# Patient Record
Sex: Female | Born: 1970 | Race: Black or African American | Hispanic: No | Marital: Single | Smoking: Former smoker
Health system: Southern US, Community
[De-identification: ages and names within clinical notes are randomized; demographics above are authoritative.]

## PROBLEM LIST (undated history)

## (undated) DIAGNOSIS — F32A Depression, unspecified: Secondary | ICD-10-CM

## (undated) DIAGNOSIS — F329 Major depressive disorder, single episode, unspecified: Secondary | ICD-10-CM

## (undated) DIAGNOSIS — D649 Anemia, unspecified: Secondary | ICD-10-CM

## (undated) DIAGNOSIS — J302 Other seasonal allergic rhinitis: Secondary | ICD-10-CM

## (undated) DIAGNOSIS — D279 Benign neoplasm of unspecified ovary: Secondary | ICD-10-CM

## (undated) DIAGNOSIS — Z973 Presence of spectacles and contact lenses: Secondary | ICD-10-CM

## (undated) DIAGNOSIS — F419 Anxiety disorder, unspecified: Secondary | ICD-10-CM

## (undated) DIAGNOSIS — Z5189 Encounter for other specified aftercare: Secondary | ICD-10-CM

## (undated) DIAGNOSIS — K219 Gastro-esophageal reflux disease without esophagitis: Secondary | ICD-10-CM

## (undated) DIAGNOSIS — N979 Female infertility, unspecified: Secondary | ICD-10-CM

## (undated) DIAGNOSIS — E282 Polycystic ovarian syndrome: Secondary | ICD-10-CM

## (undated) DIAGNOSIS — I10 Essential (primary) hypertension: Secondary | ICD-10-CM

## (undated) HISTORY — DX: Benign neoplasm of unspecified ovary: D27.9

## (undated) HISTORY — DX: Encounter for other specified aftercare: Z51.89

## (undated) HISTORY — PX: CYST EXCISION: SHX5701

## (undated) HISTORY — PX: KNEE SURGERY: SHX244

## (undated) HISTORY — DX: Gastro-esophageal reflux disease without esophagitis: K21.9

## (undated) HISTORY — DX: Female infertility, unspecified: N97.9

## (undated) HISTORY — DX: Presence of spectacles and contact lenses: Z97.3

## (undated) HISTORY — DX: Anemia, unspecified: D64.9

---

## 1999-08-26 ENCOUNTER — Other Ambulatory Visit: Admission: RE | Admit: 1999-08-26 | Discharge: 1999-08-26 | Payer: Self-pay | Admitting: Obstetrics and Gynecology

## 2001-08-23 ENCOUNTER — Other Ambulatory Visit: Admission: RE | Admit: 2001-08-23 | Discharge: 2001-08-23 | Payer: Self-pay | Admitting: Obstetrics and Gynecology

## 2006-02-16 ENCOUNTER — Ambulatory Visit (HOSPITAL_COMMUNITY): Admission: RE | Admit: 2006-02-16 | Discharge: 2006-02-16 | Payer: Self-pay | Admitting: Obstetrics and Gynecology

## 2009-10-19 ENCOUNTER — Ambulatory Visit (HOSPITAL_COMMUNITY): Admission: RE | Admit: 2009-10-19 | Discharge: 2009-10-19 | Payer: Self-pay | Admitting: Obstetrics and Gynecology

## 2010-11-08 LAB — CBC
HCT: 37.5 % (ref 36.0–46.0)
Hemoglobin: 12.5 g/dL (ref 12.0–15.0)
MCHC: 33.5 g/dL (ref 30.0–36.0)
RDW: 15.4 % (ref 11.5–15.5)

## 2010-11-08 LAB — HCG, SERUM, QUALITATIVE: Preg, Serum: NEGATIVE

## 2011-08-16 DIAGNOSIS — Z5189 Encounter for other specified aftercare: Secondary | ICD-10-CM

## 2011-08-16 HISTORY — DX: Encounter for other specified aftercare: Z51.89

## 2012-02-02 ENCOUNTER — Emergency Department (INDEPENDENT_AMBULATORY_CARE_PROVIDER_SITE_OTHER)
Admission: EM | Admit: 2012-02-02 | Discharge: 2012-02-02 | Disposition: A | Discharge status: Home / Self Care | Payer: Self-pay | Source: Home / Self Care | Attending: Emergency Medicine | Admitting: Emergency Medicine

## 2012-02-02 ENCOUNTER — Encounter (HOSPITAL_COMMUNITY): Payer: Self-pay | Admitting: *Deleted

## 2012-02-02 DIAGNOSIS — R05 Cough: Secondary | ICD-10-CM

## 2012-02-02 DIAGNOSIS — R059 Cough, unspecified: Secondary | ICD-10-CM

## 2012-02-02 DIAGNOSIS — Z760 Encounter for issue of repeat prescription: Secondary | ICD-10-CM

## 2012-02-02 HISTORY — DX: Essential (primary) hypertension: I10

## 2012-02-02 HISTORY — DX: Other seasonal allergic rhinitis: J30.2

## 2012-02-02 HISTORY — DX: Polycystic ovarian syndrome: E28.2

## 2012-02-02 MED ORDER — FEXOFENADINE HCL 180 MG PO TABS: 180.0000 mg | ORAL_TABLET | Freq: Every day | ORAL | Status: DC

## 2012-02-02 MED ORDER — METHYLDOPA 500 MG PO TABS: 500.0000 mg | ORAL_TABLET | Freq: Three times a day (TID) | ORAL | Status: DC

## 2012-02-02 MED ORDER — ALBUTEROL SULFATE HFA 108 (90 BASE) MCG/ACT IN AERS: 1.0000 | INHALATION_SPRAY | Freq: Four times a day (QID) | RESPIRATORY_TRACT | Status: DC | PRN

## 2012-02-02 MED ORDER — FLUTICASONE PROPIONATE 50 MCG/ACT NA SUSP: 2.0000 | Freq: Every day | NASAL | Status: DC

## 2012-02-02 MED ORDER — GUAIFENESIN-CODEINE 100-10 MG/5ML PO SYRP: 5.0000 mL | ORAL_SOLUTION | Freq: Four times a day (QID) | ORAL | Status: AC | PRN

## 2012-02-02 NOTE — Discharge Instructions (Signed)
Go to www.goodrx.com to look up your medications. This will give you a list of where you can find your prescriptions at the most affordable prices.   Call [facility]  [phone]  If you have no primary doctor, here are some resources that may be helpful:  Medicaid-accepting [facility] Providers:   - [facility]- [address]      [phone]      Mon-Fri 9am-7pm, Sat 9am-1pm   - [facility]- [address] [place], [place]      [phone]   - [facility]- [address] [place]      [phone]   [facility]- [address]      [phone]   - Renaye Rakers- [address] [place], [address]      [phone]      Only accepts [place] patients       after they have her name applied to their card   Self Pay (no insurance) in [place]:   - Sickle Cell Patients: Dr Willey Blade, [facility]      [address] [place]      [phone]   - [phone]   - Physician Referral Service- [phone]   - [facility]- [address] [place]      [phone]   [facility] [place]- [address]   - [facility]- see information above      (Speak to [employer] if you do not have insurance)   - [facility]- [address] [place]      [phone]   - [facility] [place]- [address]      [phone]   - [facility]- [address]      [phone]   - Dr Julio Sicks-  [address], [place]      [phone]   - [facility]- [address]      [phone]   - [facility]- [address]      [phone]      Also [address]      [phone]   - [facility]- [address]      [phone]      1st and 3rd Saturday every month, 10am-1pm    Other agencies that provide inexpensive medical care:     [facility]  [phone]    [facility]  [phone]    [facility]  [phone]    [facility]  [phone] [address] [place] [address]    [facility]  [phone]    [facility]  [phone] [facility] [phone]   [address] [place], [place] [address]  Chronic Pain Problems Contact [facility]  [phone] Patients need to be referred by their primary care doctor.  [facility]  [facility]     [employer]                          [facility] Dept. [address] [place]  [address]      [address] [place] [address]   [phone] (After Hours)  General Information: Finding a doctor when you do not have health insurance can be tricky. Although you are not limited by an insurance plan, you are of course limited by her finances and how much but he can pay out of pocket.  What are your options if you don't have health insurance?   1) Find a Librarian, academic and Pay Out of Pocket Although you won't have to find out who is covered by your insurance plan, it is a good idea to ask around and get recommendations. You will then need to call the office and see if the doctor you have chosen will accept you as a new patient and what types of options they offer for patients who are self-pay. Some doctors offer discounts or will set up payment plans for their patients who do not have insurance, but you will need to ask so you aren't surprised when you get to your appointment.  2) Contact Your Local Health Department Not all health departments have doctors that can see patients for sick visits, but many do, so it is worth a call to see if yours does. If you don't know where your local health department is, you can check in your phone book. The CDC also has a tool to help you locate your state's health department, and many state websites also have listings of  all of their local health departments.  3) Find a Walk-in Clinic If your illness is not likely to be very severe or complicated, you may want to try a walk in clinic. These are popping up all over the country in pharmacies, drugstores, and shopping centers. They're usually staffed by nurse practitioners or physician assistants that have been trained to treat common illnesses and complaints. They're usually fairly quick and inexpensive. However, if you have serious medical issues or chronic medical problems, these are probably not your best option

## 2012-02-02 NOTE — ED Provider Notes (Signed)
History     CSN: [id number]  Arrival date & time 02/02/12  1421   First MD Initiated Contact with Patient 02/02/12 1429      Chief Complaint  Patient presents with  . Cough    (Consider location/radiation/quality/duration/timing/severity/associated sxs/prior treatment) HPI Comments: Patient reports nasal congestion, itchy, watery eyes, sneezing, postnasal drip, nonproductive cough for 2 months. States her cough is worse at night, and she's unable asleep. Symptoms are worse around this time of year and after it rains. No nausea, vomiting, fevers, chest tightness, wheezing, shortness of breath.. No abdominal pain, waterbrash, reflux symptoms. She is also requesting refill of her Aldomet. States she ran out today. Is in the process of finding a new PMD.  ROS as noted in HPI. All other ROS negative.   Patient is a 41 y.o. female presenting with cough.  Cough This is a chronic problem. The current episode started more than 1 week ago. The problem occurs constantly. The problem has not changed since onset.The cough is non-productive. There has been no fever. Associated symptoms include rhinorrhea and sore throat. Pertinent negatives include no chest pain, no chills, no sweats, no ear congestion, no ear pain, no headaches, no myalgias, no shortness of breath and no eye redness. She has tried nothing for the symptoms. The treatment provided no relief. She is a smoker.    Past Medical History  Diagnosis Date  . Hypertension   . Polycystic ovary disease   . Seasonal allergies     Past Surgical History  Procedure Date  . Knee surgery     History reviewed. No pertinent family history.  History  Substance Use Topics  . Smoking status: Current Everyday Smoker  . Smokeless tobacco: Not on file  . Alcohol Use: Yes    OB History    Grav Para Term Preterm Abortions TAB SAB Ect Mult Living                  Review of Systems  Constitutional: Negative for chills.  HENT: Positive for  sore throat and rhinorrhea. Negative for ear pain.   Eyes: Negative for redness.  Respiratory: Positive for cough. Negative for shortness of breath.   Cardiovascular: Negative for chest pain.  Musculoskeletal: Negative for myalgias.  Neurological: Negative for headaches.    Allergies  Review of patient's allergies indicates no known allergies.  Home Medications   Current Outpatient Rx  Name Route Sig Dispense Refill  . METFORMIN HCL 1000 MG PO TABS Oral Take 1,000 mg by mouth 2 (two) times daily with a meal.    . ADULT MULTIVITAMIN W/MINERALS CH Oral Take 1 tablet by mouth daily.    . ALBUTEROL SULFATE HFA 108 (90 BASE) MCG/ACT IN AERS Inhalation Inhale 1-2 puffs into the lungs every 6 (six) hours as needed for wheezing. 1 Inhaler 0  . FEXOFENADINE HCL 180 MG PO TABS Oral Take 1 tablet (180 mg total) by mouth daily. 14 tablet 0  . FLUTICASONE PROPIONATE 50 MCG/ACT NA SUSP Nasal Place 2 sprays into the nose daily. 16 g 0  . GUAIFENESIN-CODEINE 100-10 MG/5ML PO SYRP Oral Take 5 mLs by mouth 4 (four) times daily as needed for cough. 120 mL 0  . METHYLDOPA 500 MG PO TABS Oral Take 1 tablet (500 mg total) by mouth 3 (three) times daily. 90 tablet 0    BP 139/88  Pulse 88  Temp 99.7 F (37.6 C) (Oral)  Resp 16  SpO2 100%  LMP 01/24/2012  Physical Exam  Nursing note and vitals reviewed. Constitutional: She is oriented to person, place, and time. She appears well-developed and well-nourished.  HENT:  Head: Normocephalic and atraumatic.  Right Ear: Tympanic membrane and ear canal normal.  Left Ear: Tympanic membrane and ear canal normal.  Nose: Mucosal edema and rhinorrhea present. No epistaxis.  Mouth/Throat: Uvula is midline and mucous membranes are normal. Posterior oropharyngeal erythema present. No oropharyngeal exudate.       Postnasal drainage, cobblestoned oropharynx (-) frontal, maxillary sinus tenderness  Eyes: Conjunctivae and EOM are normal.  Neck: Normal range of  motion. Neck supple.  Cardiovascular: Normal rate, regular rhythm and normal heart sounds.   Pulmonary/Chest: Effort normal and breath sounds normal. She exhibits no tenderness.  Abdominal: Bowel sounds are normal. She exhibits no distension.  Musculoskeletal: Normal range of motion.  Lymphadenopathy:    She has no cervical adenopathy.  Neurological: She is alert and oriented to person, place, and time.  Skin: Skin is warm and dry. No rash noted.  Psychiatric: She has a normal mood and affect. Her behavior is normal. Judgment and thought content normal.    ED Course  Procedures (including critical care time)  Labs Reviewed - No data to display No results found.   1. Cough   2. Medication refill      MDM  Cough most likely from postnasal drip. Patient has seasonal allergies, will start her Allegra, Flonase, saline nasal irrigation. Lungs are clear, but Also have her try some albuterol. Cough syrup as needed at night .Blood pressure is acceptable today. Took her last dose methyldopa this morning, will refill it until she can find primary care physician.  Luiz Blare, MD 02/02/12 [phone]

## 2012-02-02 NOTE — ED Notes (Signed)
Pt is also requesting refill of her Aldomet Rx.

## 2012-02-02 NOTE — ED Notes (Signed)
Pt is here with complaints of persistent cough x 2 months with seasonal allergies.  Reports cough is non-productive and denies fever.

## 2012-03-03 ENCOUNTER — Inpatient Hospital Stay (HOSPITAL_COMMUNITY): Payer: Self-pay

## 2012-03-03 ENCOUNTER — Emergency Department (HOSPITAL_COMMUNITY): Payer: Self-pay

## 2012-03-03 ENCOUNTER — Encounter (HOSPITAL_COMMUNITY): Payer: Self-pay | Admitting: Emergency Medicine

## 2012-03-03 ENCOUNTER — Inpatient Hospital Stay (HOSPITAL_COMMUNITY)
Admission: EM | Admit: 2012-03-03 | Discharge: 2012-03-04 | DRG: 392 | Disposition: A | Discharge status: Home / Self Care | Payer: Self-pay | Attending: Internal Medicine | Admitting: Internal Medicine

## 2012-03-03 DIAGNOSIS — R7309 Other abnormal glucose: Secondary | ICD-10-CM | POA: Diagnosis present

## 2012-03-03 DIAGNOSIS — D638 Anemia in other chronic diseases classified elsewhere: Secondary | ICD-10-CM | POA: Diagnosis present

## 2012-03-03 DIAGNOSIS — I10 Essential (primary) hypertension: Secondary | ICD-10-CM | POA: Diagnosis present

## 2012-03-03 DIAGNOSIS — D649 Anemia, unspecified: Secondary | ICD-10-CM

## 2012-03-03 DIAGNOSIS — D219 Benign neoplasm of connective and other soft tissue, unspecified: Secondary | ICD-10-CM | POA: Diagnosis present

## 2012-03-03 DIAGNOSIS — F17200 Nicotine dependence, unspecified, uncomplicated: Secondary | ICD-10-CM | POA: Diagnosis present

## 2012-03-03 DIAGNOSIS — R109 Unspecified abdominal pain: Principal | ICD-10-CM | POA: Diagnosis present

## 2012-03-03 DIAGNOSIS — D259 Leiomyoma of uterus, unspecified: Secondary | ICD-10-CM | POA: Diagnosis present

## 2012-03-03 DIAGNOSIS — Z79899 Other long term (current) drug therapy: Secondary | ICD-10-CM

## 2012-03-03 DIAGNOSIS — K625 Hemorrhage of anus and rectum: Secondary | ICD-10-CM | POA: Diagnosis present

## 2012-03-03 LAB — CBC WITH DIFFERENTIAL/PLATELET
Eosinophils Absolute: 0 10*3/uL (ref 0.0–0.7)
Lymphocytes Relative: 16 % (ref 12–46)
Lymphs Abs: 1.2 10*3/uL (ref 0.7–4.0)
MCHC: 28.8 g/dL — ABNORMAL LOW (ref 30.0–36.0)
Monocytes Absolute: 0.7 10*3/uL (ref 0.1–1.0)
Monocytes Relative: 9 % (ref 3–12)
Neutrophils Relative %: 75 % (ref 43–77)
Platelets: 519 10*3/uL — ABNORMAL HIGH (ref 150–400)
RBC: 3.74 MIL/uL — ABNORMAL LOW (ref 3.87–5.11)
WBC: 7.8 10*3/uL (ref 4.0–10.5)

## 2012-03-03 LAB — COMPREHENSIVE METABOLIC PANEL
AST: 21 U/L (ref 0–37)
Albumin: 4.1 g/dL (ref 3.5–5.2)
CO2: 21 mEq/L (ref 19–32)
Chloride: 99 mEq/L (ref 96–112)
GFR calc non Af Amer: >90 mL/min (ref >90)
Total Protein: 8 g/dL (ref 6.0–8.3)

## 2012-03-03 LAB — URINALYSIS, ROUTINE W REFLEX MICROSCOPIC
Bilirubin Urine: NEGATIVE
Glucose, UA: NEGATIVE mg/dL
Ketones, ur: NEGATIVE mg/dL
Nitrite: NEGATIVE
Specific Gravity, Urine: 1.017 (ref 1.005–1.030)
pH: 6 (ref 5.0–8.0)
pH: 6.5 (ref 5.0–8.0)

## 2012-03-03 LAB — OCCULT BLOOD, POC DEVICE: Fecal Occult Bld: POSITIVE

## 2012-03-03 LAB — PHOSPHORUS: Phosphorus: 3.2 mg/dL (ref 2.3–4.6)

## 2012-03-03 LAB — MAGNESIUM: Magnesium: 1.8 mg/dL (ref 1.5–2.5)

## 2012-03-03 LAB — URINE MICROSCOPIC-ADD ON

## 2012-03-03 LAB — RAPID URINE DRUG SCREEN, HOSP PERFORMED: Barbiturates: NOT DETECTED

## 2012-03-03 LAB — PREPARE RBC (CROSSMATCH)

## 2012-03-03 LAB — WET PREP, GENITAL: Trich, Wet Prep: NONE SEEN

## 2012-03-03 MED ORDER — ONDANSETRON HCL 4 MG/2ML IJ SOLN: 4.0000 mg | Freq: Four times a day (QID) | INTRAMUSCULAR | Status: DC | PRN

## 2012-03-03 MED ORDER — HYDROMORPHONE HCL PF 1 MG/ML IJ SOLN
1.0000 mg | Freq: Once | INTRAMUSCULAR | Status: AC
  Administered 2012-03-03: 1 mg via INTRAVENOUS
  Filled 2012-03-03: qty 1

## 2012-03-03 MED ORDER — CALCIUM GLUCONATE 500 MG PO TABS
500.0000 mg | ORAL_TABLET | Freq: Every day | ORAL | Status: DC
  Administered 2012-03-03 – 2012-03-04 (×2): 500 mg via ORAL
  Filled 2012-03-03 (×2): qty 1

## 2012-03-03 MED ORDER — MORPHINE SULFATE 2 MG/ML IJ SOLN
1.0000 mg | INTRAMUSCULAR | Status: DC | PRN
  Administered 2012-03-04 (×2): 1 mg via INTRAVENOUS
  Filled 2012-03-03 (×2): qty 1

## 2012-03-03 MED ORDER — SODIUM CHLORIDE 0.9 % IV SOLN
INTRAVENOUS | Status: DC
  Administered 2012-03-03: 23:00:00 via INTRAVENOUS

## 2012-03-03 MED ORDER — ONDANSETRON HCL 4 MG/2ML IJ SOLN
4.0000 mg | Freq: Once | INTRAMUSCULAR | Status: AC
  Administered 2012-03-03: 4 mg via INTRAVENOUS
  Filled 2012-03-03: qty 2

## 2012-03-03 MED ORDER — METHYLDOPA 500 MG PO TABS
500.0000 mg | ORAL_TABLET | Freq: Three times a day (TID) | ORAL | Status: DC
  Administered 2012-03-03 – 2012-03-04 (×2): 500 mg via ORAL
  Filled 2012-03-03 (×4): qty 1

## 2012-03-03 MED ORDER — METFORMIN HCL 500 MG PO TABS
1000.0000 mg | ORAL_TABLET | Freq: Two times a day (BID) | ORAL | Status: DC
  Filled 2012-03-03: qty 2

## 2012-03-03 MED ORDER — LORATADINE 10 MG PO TABS
10.0000 mg | ORAL_TABLET | Freq: Every day | ORAL | Status: DC
  Administered 2012-03-03 – 2012-03-04 (×2): 10 mg via ORAL
  Filled 2012-03-03 (×2): qty 1

## 2012-03-03 MED ORDER — AMLODIPINE BESYLATE 5 MG PO TABS
5.0000 mg | ORAL_TABLET | Freq: Every day | ORAL | Status: DC
  Administered 2012-03-04: 5 mg via ORAL
  Filled 2012-03-03: qty 1

## 2012-03-03 MED ORDER — IOHEXOL 300 MG/ML  SOLN
100.0000 mL | Freq: Once | INTRAMUSCULAR | Status: AC | PRN
  Administered 2012-03-03: 100 mL via INTRAVENOUS

## 2012-03-03 MED ORDER — HYDROCODONE-ACETAMINOPHEN 5-325 MG PO TABS
1.0000 | ORAL_TABLET | ORAL | Status: DC | PRN
  Administered 2012-03-04: 1 via ORAL
  Filled 2012-03-03: qty 1

## 2012-03-03 MED ORDER — FERROUS SULFATE 325 (65 FE) MG PO TABS
325.0000 mg | ORAL_TABLET | Freq: Every day | ORAL | Status: DC
  Administered 2012-03-04: 325 mg via ORAL
  Filled 2012-03-03 (×2): qty 1

## 2012-03-03 MED ORDER — ONDANSETRON HCL 4 MG PO TABS: 4.0000 mg | ORAL_TABLET | Freq: Four times a day (QID) | ORAL | Status: DC | PRN

## 2012-03-03 MED ORDER — FLUTICASONE PROPIONATE 50 MCG/ACT NA SUSP
2.0000 | Freq: Every day | NASAL | Status: DC
  Administered 2012-03-04: 2 via NASAL
  Filled 2012-03-03: qty 16

## 2012-03-03 NOTE — ED Notes (Signed)
Unable to obtain TSH lab, pt is receiving blood at this time.

## 2012-03-03 NOTE — ED Provider Notes (Signed)
History     CSN: [id number]  Arrival date & time 03/03/12  1259   First MD Initiated Contact with Patient 03/03/12 1753      Chief Complaint  Patient presents with  . Abdominal Pain    (Consider location/radiation/quality/duration/timing/severity/associated sxs/prior treatment) HPI Patient with right lower quadrant pain that began yesterday. She's had several episodes of vomiting today. She has not taken any medication for pain. She has not had any fever or chills. She has not noted any change in her menstrual cycle or her bowel habits. She denies any blood in the bowel. She is feeling generally weak and somewhat lightheaded. Past Medical History  Diagnosis Date  . Hypertension   . Polycystic ovary disease   . Seasonal allergies     Past Surgical History  Procedure Date  . Knee surgery     No family history on file.  History  Substance Use Topics  . Smoking status: Current Everyday Smoker -- 1.0 packs/day  . Smokeless tobacco: Never Used  . Alcohol Use: 4.8 oz/week    8 Cans of beer per week    OB History    Grav Para Term Preterm Abortions TAB SAB Ect Mult Living                  Review of Systems  All other systems reviewed and are negative.    Allergies  Review of patient's allergies indicates no known allergies.  Home Medications   Current Outpatient Rx  Name Route Sig Dispense Refill  . CALCIUM GLUCONATE 500 MG PO TABS Oral Take 500 mg by mouth daily.    Marland Kitchen CETIRIZINE HCL 10 MG PO TABS Oral Take 10 mg by mouth daily.    Marland Kitchen VITAMIN D 1000 UNITS PO TABS Oral Take 1,000 Units by mouth daily.    Marland Kitchen FERROUS SULFATE 325 (65 FE) MG PO TABS Oral Take 325 mg by mouth daily with breakfast.    . FLUTICASONE PROPIONATE 50 MCG/ACT NA SUSP Nasal Place 2 sprays into the nose daily. 16 g 0  . METFORMIN HCL 1000 MG PO TABS Oral Take 1,000 mg by mouth 2 (two) times daily with a meal.    . METHYLDOPA 500 MG PO TABS Oral Take 1 tablet (500 mg total) by mouth 3 (three)  times daily. 90 tablet 0  . ADULT MULTIVITAMIN W/MINERALS CH Oral Take 1 tablet by mouth daily.    Marland Kitchen VITAMIN C 500 MG PO TABS Oral Take 500 mg by mouth daily.      BP 189/95  Pulse 60  Temp 97.9 F (36.6 C) (Oral)  Resp 26  SpO2 100%  LMP 02/25/2012  Physical Exam  Nursing note and vitals reviewed. Constitutional: She is oriented to person, place, and time. She appears well-developed and well-nourished.  HENT:  Head: Normocephalic and atraumatic.  Right Ear: External ear normal.  Left Ear: External ear normal.  Nose: Nose normal.  Mouth/Throat: Oropharynx is clear and moist.  Eyes:       Conjunctiva pale  Cardiovascular: Normal rate and regular rhythm.   Pulmonary/Chest: Effort normal and breath sounds normal.  Abdominal: Soft. Bowel sounds are normal.       Tenderness rlq  Genitourinary: Vagina normal. Guaiac positive stool.       Scant blood in vaginal vault  Musculoskeletal: Normal range of motion.  Neurological: She is alert and oriented to person, place, and time. She has normal reflexes.  Skin: Skin is warm and dry.  Psychiatric: She  has a normal mood and affect.    ED Course  Procedures (including critical care time)  Labs Reviewed  URINALYSIS, ROUTINE W REFLEX MICROSCOPIC - Abnormal; Notable for the following:    Hgb urine dipstick LARGE (*)     All other components within normal limits  CBC WITH DIFFERENTIAL - Abnormal; Notable for the following:    RBC 3.74 (*)     Hemoglobin 7.7 (*)     HCT 26.7 (*)     MCV 71.4 (*)     MCH 20.6 (*)     MCHC 28.8 (*)     RDW 19.6 (*)     Platelets 519 (*)     All other components within normal limits  COMPREHENSIVE METABOLIC PANEL - Abnormal; Notable for the following:    Sodium 133 (*)     Glucose, Bld 129 (*)     BUN 4 (*)     Total Bilirubin 0.2 (*)     All other components within normal limits  POCT PREGNANCY, URINE  URINE MICROSCOPIC-ADD ON  OCCULT BLOOD X 1 CARD TO LAB, STOOL  PREPARE RBC (CROSSMATCH)    TYPE AND SCREEN   No results found.   No diagnosis found.    Results for orders placed during the hospital encounter of 03/03/12  URINALYSIS, ROUTINE W REFLEX MICROSCOPIC      Component Value Range   Color, Urine YELLOW  YELLOW   APPearance CLEAR  CLEAR   Specific Gravity, Urine 1.017  1.005 - 1.030   pH 6.0  5.0 - 8.0   Glucose, UA NEGATIVE  NEGATIVE mg/dL   Hgb urine dipstick LARGE (*) NEGATIVE   Bilirubin Urine NEGATIVE  NEGATIVE   Ketones, ur NEGATIVE  NEGATIVE mg/dL   Protein, ur NEGATIVE  NEGATIVE mg/dL   Urobilinogen, UA 0.2  0.0 - 1.0 mg/dL   Nitrite NEGATIVE  NEGATIVE   Leukocytes, UA NEGATIVE  NEGATIVE  CBC WITH DIFFERENTIAL      Component Value Range   WBC 7.8  4.0 - 10.5 K/uL   RBC 3.74 (*) 3.87 - 5.11 MIL/uL   Hemoglobin 7.7 (*) 12.0 - 15.0 g/dL   HCT 16.1 (*) 09.6 - 04.5 %   MCV 71.4 (*) 78.0 - 100.0 fL   MCH 20.6 (*) 26.0 - 34.0 pg   MCHC 28.8 (*) 30.0 - 36.0 g/dL   RDW 40.9 (*) 81.1 - 91.4 %   Platelets 519 (*) 150 - 400 K/uL   Neutrophils Relative 75  43 - 77 %   Lymphocytes Relative 16  12 - 46 %   Monocytes Relative 9  3 - 12 %   Eosinophils Relative 0  0 - 5 %   Basophils Relative 0  0 - 1 %   Neutro Abs 5.9  1.7 - 7.7 K/uL   Lymphs Abs 1.2  0.7 - 4.0 K/uL   Monocytes Absolute 0.7  0.1 - 1.0 K/uL   Eosinophils Absolute 0.0  0.0 - 0.7 K/uL   Basophils Absolute 0.0  0.0 - 0.1 K/uL   Smear Review MORPHOLOGY UNREMARKABLE    COMPREHENSIVE METABOLIC PANEL      Component Value Range   Sodium 133 (*) 135 - 145 mEq/L   Potassium 3.6  3.5 - 5.1 mEq/L   Chloride 99  96 - 112 mEq/L   CO2 21  19 - 32 mEq/L   Glucose, Bld 129 (*) 70 - 99 mg/dL   BUN 4 (*) 6 - 23 mg/dL  Creatinine, Ser 0.62  0.50 - 1.10 mg/dL   Calcium 9.5  8.4 - 16.1 mg/dL   Total Protein 8.0  6.0 - 8.3 g/dL   Albumin 4.1  3.5 - 5.2 g/dL   AST 21  0 - 37 U/L   ALT 9  0 - 35 U/L   Alkaline Phosphatase 52  39 - 117 U/L   Total Bilirubin 0.2 (*) 0.3 - 1.2 mg/dL   GFR calc non Af  Amer >90  >90 mL/min   GFR calc Af Amer >90  >90 mL/min  POCT PREGNANCY, URINE      Component Value Range   Preg Test, Ur NEGATIVE  NEGATIVE  URINE MICROSCOPIC-ADD ON      Component Value Range   Squamous Epithelial / LPF RARE  RARE   RBC / HPF TOO NUMEROUS TO COUNT  <3 RBC/hpf  PREPARE RBC (CROSSMATCH)      Component Value Range   Order Confirmation ORDER PROCESSED BY BLOOD BANK    TYPE AND SCREEN      Component Value Range   ABO/RH(D) O POS     Antibody Screen NEG     Sample Expiration 03/06/2012     Unit Number [id number]     Blood Component Type RED CELLS,LR     Unit division 00     Status of Unit ALLOCATED     Transfusion Status OK TO TRANSFUSE     Crossmatch Result Compatible    OCCULT BLOOD, POC DEVICE      Component Value Range   Fecal Occult Bld POSITIVE    URINALYSIS, ROUTINE W REFLEX MICROSCOPIC      Component Value Range   Color, Urine YELLOW  YELLOW   APPearance CLEAR  CLEAR   Specific Gravity, Urine 1.008  1.005 - 1.030   pH 6.5  5.0 - 8.0   Glucose, UA NEGATIVE  NEGATIVE mg/dL   Hgb urine dipstick NEGATIVE  NEGATIVE   Bilirubin Urine NEGATIVE  NEGATIVE   Ketones, ur NEGATIVE  NEGATIVE mg/dL   Protein, ur NEGATIVE  NEGATIVE mg/dL   Urobilinogen, UA 0.2  0.0 - 1.0 mg/dL   Nitrite NEGATIVE  NEGATIVE   Leukocytes, UA NEGATIVE  NEGATIVE  ABO/RH      Component Value Range   ABO/RH(D) O POS    WET PREP, GENITAL      Component Value Range   Yeast Wet Prep HPF POC NONE SEEN  NONE SEEN   Trich, Wet Prep NONE SEEN  NONE SEEN   Clue Cells Wet Prep HPF POC FEW (*) NONE SEEN   WBC, Wet Prep HPF POC FEW (*) NONE SEEN      Rectal bleeding patient has heme positive stool hemoglobin decreased to 7.8 from 12 in 2011.  Patient also has right lower quadrant pain. She has polycystic ovary disease and has somewhat irregular periods but has been on her menstrual cycle over the past week and bleeding has almost stopped. She does not describe her recent meds her menstrual  cycles is any heavier than previous. Having one unit of packed red blood cells transfused. CT scan is currently pending.   9:38 PM Discussed with Dr. Izola Price and she will place orders.  Patient remains hemodynamically stable transfusing one unit prbc.  No obvious source of pain on ct scan and pain is mild on palpation.  Urine clear on cath specimen.    CRITICAL CARE Performed by: Hilario Quarry   Total critical care time: 30  Critical care  time was exclusive of separately billable procedures and treating other patients.  Critical care was necessary to treat or prevent imminent or life-threatening deterioration.  Critical care was time spent personally by me on the following activities: development of treatment plan with patient and/or surrogate as well as nursing, discussions with consultants, evaluation of patient's response to treatment, examination of patient, obtaining history from patient or surrogate, ordering and performing treatments and interventions, ordering and review of laboratory studies, ordering and review of radiographic studies, pulse oximetry and re-evaluation of patient's condition.   Hilario Quarry, MD 03/03/12 2139

## 2012-03-03 NOTE — ED Notes (Signed)
Presents w/ RLQ pain onset yesterday morning, N/V twice while at work yesterday. Rx'ed w/ Miralax w/ BM but did not have any positive effect on pain. Menses currently. Denies [place] Sx.

## 2012-03-03 NOTE — H&P (Addendum)
[facility] History and Physical  SHANAIYA BENE ZOX:[id number] DOB: 1971/07/06 DOA: 03/03/2012  Referring physician: ED physician PCP: No primary provider on file.   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 Pain is rather chronic in nature on the left side of the abdomen and unchanged but right lower quadrant abdominal pain is new since yesterday and much worse over the past 24 hours. Pt denies similar episodes in the past, denies fevers, chills, shortness of breath, chest pain, also denies urinary concerns. Pt denies specific focal neurologic weakness. She get frequent [place] for follow up on tumor and the last one was 05/2011. She denies noticing any blood in stool and is currently towards the end of her menstrual cycle.   Assessment and Plan:  Principal Problem:  *Abdominal  pain, other specified site with nausea and vomiting - unclear etiology at this time but possibly related to fibroid as noted on CT scan - recommendation is to proceed with pelvic ultrasound for further evaluation and will therefore place the order - will also provide supportive care with analgesia as needed for adequate pain control, antiemetics as needed - may need GYN consult in AM  Active Problems:  Anemia due to chronic illness, microcytic  - unclear etiology and unclear if rectal bleeding only contributing etiology vs fibroid - pelvic exam done by Dr. Rosalia Hammers in ED and only scant blood noted in vaginal vault  - FOBT was positive - will go ahead and proceed with transfusion as was already started in ED - will  obtain follow up CBC and will decide if further transfusion indicated - also check CBC in AM   Rectal bleeding - FOBT positive - pt denies any visible blood in stool - CBC in AM   Fibroid - as noted per CT scan - this may likely be worked up in an outpt setting but will defer to primary team in AM to make the decision   Accelerated hypertension - will initiate blood pressure medication - monitor vitals per floor protocol and readjust the regimen as indicated   Hyperglycemia - will check A1C for now  Code Status: Full Family Communication: Pt at bedside Disposition Plan: PT evaluation    Review of Systems:  Constitutional: Negative for fever, chills, positive for malaise/fatigue. Negative for diaphoresis.  HENT: Negative for hearing loss, ear pain, nosebleeds, congestion, sore throat, neck pain, tinnitus and ear discharge.   Eyes: Negative for blurred vision, double vision, photophobia, pain, discharge and redness.  Respiratory: Negative for cough, hemoptysis, sputum production, shortness of breath, wheezing and stridor.   Cardiovascular: Negative for chest pain, palpitations, orthopnea, claudication and leg swelling.  Gastrointestinal: Positive for nausea, and abdominal pain. Negative for heartburn, constipation, negative for blood in stool. Genitourinary: Negative for dysuria, urgency, frequency, hematuria and flank pain.  Musculoskeletal: Negative for myalgias, back pain, joint pain and falls.  Skin: Negative for itching and rash.  Neurological: Negative for dizziness, positive for generalized weakness. Negative for tingling, tremors, sensory change, speech change, focal weakness, loss of consciousness and headaches.  Endo/Heme/Allergies: Negative for environmental allergies and polydipsia. Does not bruise/bleed easily.  Psychiatric/Behavioral: Negative  for suicidal ideas. The patient is not nervous/anxious.      Past Medical History  Diagnosis Date  . Hypertension   .  Polycystic ovary disease   . Seasonal allergies     Past Surgical History  Procedure Date  . Knee surgery     Social History:  reports that she has been smoking.  She has never used smokeless tobacco. She reports that she drinks about 4.8 ounces of alcohol per week. She reports that she does not use illicit drugs.  No Known Allergies  Family history, no cardiac disease, no strokes, no cancers.  Prior to Admission medications   Medication Sig Start Date End Date Taking? Authorizing Provider  calcium gluconate 500 MG tablet Take 500 mg by mouth daily.   Yes Historical Provider, MD  cetirizine (ZYRTEC) 10 MG tablet Take 10 mg by mouth daily.   Yes Historical Provider, MD  cholecalciferol (VITAMIN D) 1000 UNITS tablet Take 1,000 Units by mouth daily.   Yes Historical Provider, MD  ferrous sulfate 325 (65 FE) MG tablet Take 325 mg by mouth daily with breakfast.   Yes Historical Provider, MD  fluticasone (FLONASE) 50 MCG/ACT nasal spray Place 2 sprays into the nose daily. 02/02/12 02/01/13 Yes Luiz Blare, MD  metFORMIN (GLUCOPHAGE) 1000 MG tablet Take 1,000 mg by mouth 2 (two) times daily with a meal.   Yes Historical Provider, MD  methyldopa (ALDOMET) 500 MG tablet Take 1 tablet (500 mg total) by mouth 3 (three) times daily. 02/02/12  Yes Luiz Blare, MD  Multiple Vitamin (MULTIVITAMIN WITH MINERALS) TABS Take 1 tablet by mouth daily.   Yes Historical Provider, MD  vitamin C (ASCORBIC ACID) 500 MG tablet Take 500 mg by mouth daily.   Yes Historical Provider, MD    Physical Exam: Filed Vitals:   03/03/12 1336 03/03/12 1743 03/03/12 2117  BP: 172/88 189/95 185/95  Pulse: 67 60 87  Temp: 98.8 F (37.1 C) 97.9 F (36.6 C) 98.3 F (36.8 C)  TempSrc: Oral Oral Oral  Resp:  26 22  SpO2: 98% 100%     Physical Exam  Constitutional: Appears well-developed and well-nourished. No distress.  HENT: Normocephalic. External right and left ear normal. Oropharynx is clear and moist.    Eyes: Conjunctivae pale, EOM are normal. PERRLA, no scleral icterus.  Neck: Normal ROM. Neck supple. No JVD. No tracheal deviation. No thyromegaly.  CVS: RRR, S1/S2 +, no murmurs, no gallops, no carotid bruit.  Pulmonary: Effort and breath sounds normal, no stridor, rhonchi, wheezes, rales.  Abdominal: Soft. BS +,  no distension, tenderness in epigastric area R > L, rebound or guarding.  Musculoskeletal: Normal range of motion. No edema and no tenderness.  Lymphadenopathy: No lymphadenopathy noted, cervical, inguinal. Neuro: Alert. Normal reflexes, muscle tone coordination. No cranial nerve deficit. Skin: Skin is warm and dry. No rash noted. Not diaphoretic. No erythema. No pallor.  Psychiatric: Normal mood and affect. Behavior, judgment, thought content normal.   Labs on Admission:  Basic Metabolic Panel:  Lab 03/03/12 6213  NA 133*  K 3.6  CL 99  CO2 21  GLUCOSE 129*  BUN 4*  CREATININE 0.62  CALCIUM 9.5  MG --  PHOS --   Liver Function Tests:  Lab 03/03/12 1441  AST 21  ALT 9  ALKPHOS 52  BILITOT 0.2*  PROT 8.0  ALBUMIN 4.1   CBC:  Lab 03/03/12 1441  WBC 7.8  NEUTROABS 5.9  HGB 7.7*  HCT 26.7*  MCV  71.4*  PLT 519*    Radiological Exams on Admission: Ct Abdomen Pelvis W Contrast  03/03/2012  *RADIOLOGY REPORT*  Clinical Data: Right-sided abdominal pain.  Nausea, vomiting. History of hypertension, polycystic ovary disease.  CT ABDOMEN AND PELVIS WITH CONTRAST  Technique:  Multidetector CT imaging of the abdomen and pelvis was performed following the standard protocol during bolus administration of intravenous contrast.  Contrast: OMNIPAQUE IOHEXOL 300 MG/ML  SOLN  Comparison: None.  Findings: Images of the lung bases are unremarkable.  No focal abnormality identified within the liver, spleen, pancreas, or kidneys. Gallbladder is present.  The stomach and small bowel loops have a normal appearance. The appendix is well seen and has a normal appearance.   Colonic loops are normal in appearance.  The uterus is present and is enlarged and heterogeneous.  A fibroid measures 5.2 x 5.2 cm, likely in the left uterine fundus.  The ovaries are enlarged.  Left ovary is estimated to measure 4.5 x 7.6 x 3.4 cm.  Within the right adnexa, the right ovary appears more heterogeneous than the left and measures 10.0 x 4.0 x 5.1 cm. Further evaluation with pelvic ultrasound is recommended.  Small amount free pelvic fluid is suspected.  No pelvic adenopathy. No evidence for aortic aneurysm.  IMPRESSION:  1.  Heterogeneous, enlarged uterus consistent with fibroid in the fundal region. 2.  Suspect bilateral ovarian enlargement.  Right ovary/adnexal mass measures approximately 10 cm.  Further evaluation with pelvic ultrasound is suggested to exclude neoplasm.  Original Report Authenticated By: Patterson Hammersmith, M.D.    EKG: Not done as it is not indicated  Debbora Presto, MD  [facility] Pager [phone]  If 7PM-7AM, please contact night-coverage [email or web address] Password [facility] 03/03/2012, 9:41 PM

## 2012-03-03 NOTE — ED Notes (Signed)
Received report from previous RN.

## 2012-03-04 ENCOUNTER — Encounter (HOSPITAL_COMMUNITY): Payer: Self-pay

## 2012-03-04 DIAGNOSIS — D259 Leiomyoma of uterus, unspecified: Secondary | ICD-10-CM

## 2012-03-04 LAB — CBC
HCT: 27.7 % — ABNORMAL LOW (ref 36.0–46.0)
MCHC: 30.3 g/dL (ref 30.0–36.0)
MCV: 73.1 fL — ABNORMAL LOW (ref 78.0–100.0)
RDW: 19.9 % — ABNORMAL HIGH (ref 11.5–15.5)

## 2012-03-04 LAB — LIPID PANEL
Cholesterol: 186 mg/dL (ref 0–200)
LDL Cholesterol: 106 mg/dL — ABNORMAL HIGH (ref 0–99)
Triglycerides: 100 mg/dL (ref <150)

## 2012-03-04 LAB — BASIC METABOLIC PANEL
BUN: 3 mg/dL — ABNORMAL LOW (ref 6–23)
CO2: 24 mEq/L (ref 19–32)
Chloride: 101 mEq/L (ref 96–112)
Creatinine, Ser: 0.59 mg/dL (ref 0.50–1.10)

## 2012-03-04 LAB — HEMOGLOBIN AND HEMATOCRIT, BLOOD
HCT: 29.7 % — ABNORMAL LOW (ref 36.0–46.0)
Hemoglobin: 8.9 g/dL — ABNORMAL LOW (ref 12.0–15.0)

## 2012-03-04 LAB — TYPE AND SCREEN
ABO/RH(D): O POS
Unit division: 0

## 2012-03-04 LAB — HEMOGLOBIN A1C: Hgb A1c MFr Bld: 5.3 % (ref <5.7)

## 2012-03-04 MED ORDER — PANTOPRAZOLE SODIUM 40 MG PO TBEC: 40.0000 mg | DELAYED_RELEASE_TABLET | Freq: Every day | ORAL | Status: DC

## 2012-03-04 MED ORDER — AMLODIPINE BESYLATE 5 MG PO TABS: 10.0000 mg | ORAL_TABLET | Freq: Every day | ORAL | Status: DC

## 2012-03-04 MED ORDER — METFORMIN HCL 1000 MG PO TABS: 1000.0000 mg | ORAL_TABLET | Freq: Two times a day (BID) | ORAL | Status: DC

## 2012-03-04 MED ORDER — METRONIDAZOLE 500 MG PO TABS
500.0000 mg | ORAL_TABLET | Freq: Three times a day (TID) | ORAL | Status: DC
  Administered 2012-03-04: 500 mg via ORAL
  Filled 2012-03-04 (×4): qty 1

## 2012-03-04 MED ORDER — BISACODYL 10 MG RE SUPP
10.0000 mg | Freq: Once | RECTAL | Status: AC
Start: 2012-03-04 — End: 2012-03-04
  Administered 2012-03-04: 10 mg via RECTAL
  Filled 2012-03-04: qty 1

## 2012-03-04 MED ORDER — METFORMIN HCL 500 MG PO TABS: 1000.0000 mg | ORAL_TABLET | Freq: Two times a day (BID) | ORAL | Status: DC

## 2012-03-04 MED ORDER — METRONIDAZOLE 500 MG PO TABS: 500.0000 mg | ORAL_TABLET | Freq: Three times a day (TID) | ORAL | Status: AC

## 2012-03-04 MED ORDER — DSS 100 MG PO CAPS: 100.0000 mg | ORAL_CAPSULE | Freq: Two times a day (BID) | ORAL | Status: AC | PRN

## 2012-03-04 MED ORDER — DOCUSATE SODIUM 100 MG PO CAPS
200.0000 mg | ORAL_CAPSULE | Freq: Every day | ORAL | Status: DC
  Administered 2012-03-04: 200 mg via ORAL
  Filled 2012-03-04: qty 2

## 2012-03-04 NOTE — Discharge Summary (Addendum)
[facility]                                                                                   Tara Briggs, is a 41 y.o. female  DOB July 30, 1971  MRN [id number].  Admission date:  03/03/2012  Discharge Date:  03/04/2012  Primary MD  No primary provider on file.  Admitting Physician  Dorothea Ogle, MD  Admission Diagnosis  Abdominal  pain, other specified site [789.09] Rectal bleeding [569.3] Anemia [285.9] Abdominal pain [789.00] NAUSEA AND ABDOMINAL PAIN  Discharge Diagnosis     Principal Problem:  *Abdominal  pain, other specified site Active Problems:  Anemia due to chronic illness  Rectal bleeding  Fibroid    Past Medical History  Diagnosis Date  . Hypertension   . Polycystic ovary disease   . Seasonal allergies     Past Surgical History  Procedure Date  . Knee surgery     Recommendations for primary care physician for things to follow:   Outpatient workup for iron deficiency anemia and Hemoccult positive stools. One time outpatient GI followup recommended.  Close followup with her OB/GYN for her history of ovarian tumors/polycystic ovarian disease  Monitor her final vagina culture results and repeat CBC BMP.    Discharge Diagnoses:   Principal Problem:  *Abdominal  pain, other specified site Active Problems:  Anemia due to chronic illness  Rectal bleeding  Fibroid    Discharge Condition: Stable   Diet recommendation: See Discharge Instructions below   Consults none   Major procedures - PLEASE review detailed and final reports for all details in brief -   CT abdomen pelvis and trans vagina ultrasound   History of present illness and  Hospital Course:  See H&P, Labs, Consult and Test reports for all details in brief, patient was admitted for nonspecific  lower quadrants abdominal pain which is now completely resolved, patient now thinks that this could have been due to constipation, she's currently completely  pain free. Patient does have history of ovarian tumors for which she follows at [facility], her CT and ultrasound both were suggestive of the same along with uterine fibroid, Joslyn Devon has been requested to follow with her OB/GYN physician at [facility] within the next 5-7 days which he has agreed to do, he has been requested to get a copy of CT and ultrasound reports and show it to the physician next visit.   Patient has history of iron deficiency anemia and during her workup in the ER she was found to be Hemoccult-positive during her rectal exam, patient denies noticing any frank blood or melena. He did get 1 unit of packed RBC in the ER, Her repeat H&H is stable, she is on iron supplements which will be continued and I have recommended that she follow with a GI physician in the next 1-2 weeks for outpatient workup for iron deficiency anemia and heme-positive stool. Discussed the case with Dr. Dulce Sellar GI physician on-call who decreased that no inpatient workup is necessary and likely had anemia is due to her heavy periods which patient has been experiencing for the last 2 yrs.   Patient's wet prep  which was obtained in the ER did show some clue cells she denies any unprotected sex in fact she's not sexually active for the last 3-4 months, however in the light of her lower quadrant pain small possibility of bacterial vaginosis cannot be ruled out I will place her on a 7 day treatment of Flagyl. Will request primary care physician to kindly follow final vaginal culture results. His management has been requested to provide patient unless of possible primary care physicians that she can follow with.    Patient is on Glucophage likely due to polycystic ovarian disease her A1c here was 5.3 her Glucophage has been held for the next 3 days due to her receiving IV contrast for her CT scan in the ER.   Patient will receive Colace for her constipation when she goes home. Of note she is completely symptom  free at this time in regards to abdominal pain and discomfort.    Today   Subjective:   Tara Briggs today has no headache,no chest abdominal pain,no new weakness tingling or numbness, feels much better wants to go home today.   Objective:   Blood pressure 155/90, pulse 70, temperature 98.4 F (36.9 C), temperature source Oral, resp. rate 18, height 5\' 4"  (1.626 m), weight 71.4 kg (157 lb 6.5 oz), last menstrual period 02/25/2012, SpO2 95.00%.   Intake/Output Summary (Last 24 hours) at 03/04/12 1003 Last data filed at 03/04/12 0529  Gross per 24 hour  Intake   2290 ml  Output    800 ml  Net   1490 ml    Exam Awake Alert, Oriented *3, No new F.N deficits, Normal affect [place].AT,PERRAL Supple Neck,No JVD, No cervical lymphadenopathy appriciated.  Symmetrical Chest wall movement, Good air movement bilaterally, CTAB RRR,No Gallops,Rubs or new Murmurs, No Parasternal Heave +ve B.Sounds, Abd Soft, Non tender, No organomegaly appriciated, No rebound -guarding or rigidity. No Cyanosis, Clubbing or edema, No new Rash or bruise  Data Review      Radiology Reports - please see full reports for all details US Transvaginal Non-ob  03/04/2012  *RADIOLOGY REPORT*  Clinical Data: Fibroid uterus.  Question of neoplasm.  History of polycystic ovary disease.  TRANSABDOMINAL AND TRANSVAGINAL ULTRASOUND OF PELVIS Technique:  Both transabdominal and transvaginal ultrasound examinations of the pelvis were performed. Transabdominal technique was performed for global imaging of the pelvis including uterus, ovaries, adnexal regions, and pelvic cul-de-sac.  It was necessary to proceed with endovaginal exam following the transabdominal exam to visualize the uterus and adnexal regions. LMP 02/25/2012.  Comparison:  CT of the abdomen and pelvis 03/03/2012  Findings:  Uterus: The uterus is 11. 6 x 6.4 x 8.0 cm.  Fibroid in the fundal region is 5.7 x 4.8 x 5.1 cm.  Endometrium: The endometrial canal  contains a small amount of fluid.  Endometrium is 14.6 mm in thickness.  Right ovary:  The right ovary is 8.1 x 3.5 x 4.4 cm.  The ovary is heterogeneous with mixed echogenicity. Majority of the ovary appears solid.  Few follicles are present.  Left ovary: Left ovary is 8.8 x 3.3 x 4.4 cm.  There is complex appearance of the left ovary as well with heterogeneous appearance. Portions of the ovary shadow, suggesting calcification. Ovary is only seen on transabdominal evaluation. Few if any follicles identified.  Other findings: There is a trace of free pelvic fluid.  IMPRESSION:  1.  Enlarged uterus, containing a 5.7 cm fibroid. 2.  Enlarged, heterogeneous appearing ovaries bilaterally.  The  appearance would be atypical for polycystic ovary disease. Neoplasms are not excluded and should be considered. Primary or metastatic neoplasms are considerations.  The findings were discussed with Dr. Izola Price on 03/04/2012 at 12:59 a.m.  Per discussion with Dr. 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  Previous pelvic ultrasound performed in October 2012 at [facility].  Correlation with that exam is recommended.  If there have been significant changes in either ovary, tissue diagnosis may be indicated.  Original Report Authenticated By: Patterson Hammersmith, M.D.   US Pelvis Complete  03/04/2012  *RADIOLOGY REPORT*  Clinical Data: Fibroid uterus.  Question of neoplasm.  History of polycystic ovary disease.  TRANSABDOMINAL AND TRANSVAGINAL ULTRASOUND OF PELVIS Technique:  Both transabdominal and transvaginal ultrasound examinations of the pelvis were performed. Transabdominal technique was performed for global imaging of the pelvis including uterus, ovaries, adnexal regions, and pelvic cul-de-sac.  It was necessary to proceed with endovaginal exam following the transabdominal exam to visualize the uterus and adnexal regions. LMP 02/25/2012.  Comparison:  CT of the abdomen and pelvis 03/03/2012   Findings:  Uterus: The uterus is 11. 6 x 6.4 x 8.0 cm.  Fibroid in the fundal region is 5.7 x 4.8 x 5.1 cm.  Endometrium: The endometrial canal contains a small amount of fluid.  Endometrium is 14.6 mm in thickness.  Right ovary:  The right ovary is 8.1 x 3.5 x 4.4 cm.  The ovary is heterogeneous with mixed echogenicity. Majority of the ovary appears solid.  Few follicles are present.  Left ovary: Left ovary is 8.8 x 3.3 x 4.4 cm.  There is complex appearance of the left ovary as well with heterogeneous appearance. Portions of the ovary shadow, suggesting calcification. Ovary is only seen on transabdominal evaluation. Few if any follicles identified.  Other findings: There is a trace of free pelvic fluid.  IMPRESSION:  1.  Enlarged uterus, containing a 5.7 cm fibroid. 2.  Enlarged, heterogeneous appearing ovaries bilaterally.  The appearance would be atypical for polycystic ovary disease. Neoplasms are not excluded and should be considered. Primary or metastatic neoplasms are considerations.  The findings were discussed with Dr. Izola Price on 03/04/2012 at 12:59 a.m.  Per discussion with Dr. 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  Previous pelvic ultrasound performed in October 2012 at [facility].  Correlation with that exam is recommended.  If there have been significant changes in either ovary, tissue diagnosis may be indicated.  Original Report Authenticated By: Patterson Hammersmith, M.D.   Ct Abdomen Pelvis W Contrast  03/03/2012  *RADIOLOGY REPORT*  Clinical Data: Right-sided abdominal pain.  Nausea, vomiting. History of hypertension, polycystic ovary disease.  CT ABDOMEN AND PELVIS WITH CONTRAST  Technique:  Multidetector CT imaging of the abdomen and pelvis was performed following the standard protocol during bolus administration of intravenous contrast.  Contrast: OMNIPAQUE IOHEXOL 300 MG/ML  SOLN  Comparison: None.  Findings: Images of the lung bases are unremarkable.   No focal abnormality identified within the liver, spleen, pancreas, or kidneys. Gallbladder is present.  The stomach and small bowel loops have a normal appearance. The appendix is well seen and has a normal appearance.  Colonic loops are normal in appearance.  The uterus is present and is enlarged and heterogeneous.  A fibroid measures 5.2 x 5.2 cm, likely in the left uterine fundus.  The ovaries are enlarged.  Left ovary is estimated to measure 4.5 x 7.6  x 3.4 cm.  Within the right adnexa, the right ovary appears more heterogeneous than the left and measures 10.0 x 4.0 x 5.1 cm. Further evaluation with pelvic ultrasound is recommended.  Small amount free pelvic fluid is suspected.  No pelvic adenopathy. No evidence for aortic aneurysm.  IMPRESSION:  1.  Heterogeneous, enlarged uterus consistent with fibroid in the fundal region. 2.  Suspect bilateral ovarian enlargement.  Right ovary/adnexal mass measures approximately 10 cm.  Further evaluation with pelvic ultrasound is suggested to exclude neoplasm.  Original Report Authenticated By: Patterson Hammersmith, M.D.    Micro Results  Recent Results (from the past 240 hour(s))  WET PREP, GENITAL     Status: Abnormal   Collection Time   03/03/12  8:19 PM      Component Value Range Status Comment   Yeast Wet Prep HPF POC NONE SEEN  NONE SEEN Final    Trich, Wet Prep NONE SEEN  NONE SEEN Final    Clue Cells Wet Prep HPF POC FEW (*) NONE SEEN Final    WBC, Wet Prep HPF POC FEW (*) NONE SEEN Final      CBC w Diff: Lab Results  Component Value Date   WBC 7.2 03/04/2012   HGB 8.4* 03/04/2012   HCT 27.7* 03/04/2012   PLT 410* 03/04/2012   LYMPHOPCT 16 03/03/2012   MONOPCT 9 03/03/2012   EOSPCT 0 03/03/2012   BASOPCT 0 03/03/2012    CMP: Lab Results  Component Value Date   NA 135 03/04/2012   K 3.5 03/04/2012   CL 101 03/04/2012   CO2 24 03/04/2012   BUN 3* 03/04/2012   CREATININE 0.59 03/04/2012   PROT 8.0 03/03/2012   ALBUMIN 4.1 03/03/2012   BILITOT 0.2*  03/03/2012   ALKPHOS 52 03/03/2012   AST 21 03/03/2012   ALT 9 03/03/2012  .  Lab Results  Component Value Date   HGBA1C 5.3 03/03/2012    CBG (last 3)   Basename 03/04/12 0751 03/04/12 0112  GLUCAP 88 135*      Discharge Instructions     Do not take your Metformin till 03-07-12  Follow with Primary MD 3 days, you must follow with your OB-GYN physician at [facility] next week, show him the CT and ultrasound reports from this admission.  Please follow your final culture results with the physician.  You also need to follow with the stomach Dr. as described below one time over the 1 one to 2 weeks.   Get CBC, CMP, checked 3 days by Primary MD and again as instructed by your Primary MD.   Get Medicines reviewed and adjusted.  Please request your Prim.MD to go over all Hospital Tests and Procedure/Radiological results at the follow up, please get all Hospital records sent to your Prim MD by signing hospital release before you go home.  Activity: As tolerated with Full fall precautions use walker/cane & assistance as needed   Diet:  Heart healthy low carb  For Heart failure patients - Check your Weight same time everyday, if you gain over 2 pounds, or you develop in leg swelling, experience more shortness of breath or chest pain, call your Primary MD immediately. Follow Cardiac Low Salt Diet and 1.8 lit/day fluid restriction.  Disposition Home   If you experience worsening of your admission symptoms, develop shortness of breath, life threatening emergency, suicidal or homicidal thoughts you must seek medical attention immediately by calling 911 or calling your MD immediately  if symptoms  less severe.  You Must read complete instructions/literature along with all the possible adverse reactions/side effects for all the Medicines you take and that have been prescribed to you. Take any new Medicines after you have completely understood and accpet all the possible adverse reactions/side  effects.   Do not drive if your were admitted for syncope or siezures until you have seen by Primary MD or a Neurologist and advised to drive.  Do not drive when taking Pain medications.    Do not take more than prescribed Pain, Sleep and Anxiety Medications  Special Instructions: If you have smoked or chewed Tobacco  in the last 2 yrs please stop smoking, stop any regular Alcohol  and or any Recreational drug use.  Wear Seat belts while driving.  Follow-up Information    Follow up with Fermin Schwab, MD. Schedule an appointment as soon as possible for a visit in 1 week.   Contact information:   [facility]. [address] [place] [address] [phone]       Follow up with Freddy Jaksch, MD. Schedule an appointment as soon as possible for a visit in 1 week.   Contact information:   [address] [place] [address] [phone]       Follow up with Primary care physician of choice. Schedule an appointment as soon as possible for a visit in 3 days.           Discharge Medications   Medication List  As of 03/04/2012 10:03 AM   START taking these medications         amLODipine 5 MG tablet   Commonly known as: NORVASC   Take 2 tablets (10 mg total) by mouth daily.      DSS 100 MG Caps   Take 100 mg by mouth 2 (two) times daily as needed for constipation.      metroNIDAZOLE 500 MG tablet   Commonly known as: FLAGYL   Take 1 tablet (500 mg total) by mouth every 8 (eight) hours.      pantoprazole 40 MG tablet   Commonly known as: PROTONIX   Take 1 tablet (40 mg total) by mouth daily.         CONTINUE taking these medications         calcium gluconate 500 MG tablet      cetirizine 10 MG tablet   Commonly known as: ZYRTEC      cholecalciferol 1000 UNITS tablet   Commonly known as: VITAMIN D      ferrous sulfate 325 (65 FE) MG tablet      fluticasone 50 MCG/ACT nasal spray   Commonly known as:  FLONASE   Place 2 sprays into the nose daily.      metFORMIN 1000 MG tablet   Commonly known as: GLUCOPHAGE   Take 1 tablet (1,000 mg total) by mouth 2 (two) times daily with a meal.   Start taking on: 03/07/2012      methyldopa 500 MG tablet   Commonly known as: ALDOMET   Take 1 tablet (500 mg total) by mouth 3 (three) times daily.      multivitamin with minerals Tabs      vitamin C 500 MG tablet   Commonly known as: ASCORBIC ACID          Where to get your medications    These are the prescriptions that you need to pick up.   You may get these medications  from any pharmacy.         amLODipine 5 MG tablet   DSS 100 MG Caps   metroNIDAZOLE 500 MG tablet   pantoprazole 40 MG tablet         Information on where to get these meds is not yet available. Ask your nurse or doctor.         metFORMIN 1000 MG tablet               Total Time in preparing paper work, data evaluation and todays exam - 35 minutes  Leroy Sea M.D on 03/04/2012 at 10:03 AM  [facility] Office  [phone]

## 2012-03-04 NOTE — ED Notes (Signed)
Report called to floor RN

## 2012-03-04 NOTE — Progress Notes (Signed)
PHARMACIST - PHYSICIAN COMMUNICATION DR:  [facility] CONCERNING:  METFORMIN SAFE ADMINISTRATION POLICY  RECOMMENDATION: Metformin has been scheduled to start 7/23 am. (~48 hrs post IV contrast exposure)  Current safety recommendations include avoiding metformin for a minimum of 48 hours after the patient's exposure to intravenous contrast media. Lorenza Evangelist 03/04/2012 1:16 AM

## 2012-03-04 NOTE — Progress Notes (Signed)
Lab called stated pt did not have TSH drawn patient is discharged. Annitta Needs, RN

## 2012-03-04 NOTE — Progress Notes (Signed)
Pt discharged to home  provided discharge instructions and prescriptions and copy of CT and ultrasound report along with handouts. Pt verbalized understanding of discharge information. Pt stable. Pt transported by tech IV removed and documented. Annitta Needs, RN

## 2012-03-05 LAB — GC/CHLAMYDIA PROBE AMP, GENITAL: GC Probe Amp, Genital: NEGATIVE

## 2012-05-26 ENCOUNTER — Ambulatory Visit (INDEPENDENT_AMBULATORY_CARE_PROVIDER_SITE_OTHER): Payer: BC Managed Care – PPO | Admitting: Family Medicine

## 2012-05-26 ENCOUNTER — Telehealth: Payer: Self-pay

## 2012-05-26 VITALS — BP 150/97 | HR 80 | Temp 98.4°F | Resp 18 | Wt 155.0 lb

## 2012-05-26 DIAGNOSIS — N939 Abnormal uterine and vaginal bleeding, unspecified: Secondary | ICD-10-CM

## 2012-05-26 DIAGNOSIS — I10 Essential (primary) hypertension: Secondary | ICD-10-CM

## 2012-05-26 DIAGNOSIS — N898 Other specified noninflammatory disorders of vagina: Secondary | ICD-10-CM

## 2012-05-26 DIAGNOSIS — D509 Iron deficiency anemia, unspecified: Secondary | ICD-10-CM

## 2012-05-26 LAB — POCT URINALYSIS DIPSTICK
Glucose, UA: NEGATIVE
Leukocytes, UA: NEGATIVE
Nitrite, UA: NEGATIVE
Protein, UA: NEGATIVE
Urobilinogen, UA: 0.2

## 2012-05-26 LAB — POCT UA - MICROSCOPIC ONLY
Casts, Ur, LPF, POC: NEGATIVE
Yeast, UA: NEGATIVE

## 2012-05-26 LAB — IBC PANEL: %SAT: 2 % — ABNORMAL LOW (ref 20–55)

## 2012-05-26 LAB — POCT CBC
Granulocyte percent: 49.4 %G (ref 37–80)
HCT, POC: 37.8 % (ref 37.7–47.9)
Hemoglobin: 10.3 g/dL — AB (ref 12.2–16.2)
MCV: 85.8 fL (ref 80–97)
POC LYMPH PERCENT: 44 %L (ref 10–50)
RDW, POC: 20.6 %

## 2012-05-26 LAB — COMPREHENSIVE METABOLIC PANEL
ALT: 16 U/L (ref 0–35)
AST: 20 U/L (ref 0–37)
Alkaline Phosphatase: 43 U/L (ref 39–117)
Sodium: 139 mEq/L (ref 135–145)
Total Bilirubin: 0.2 mg/dL — ABNORMAL LOW (ref 0.3–1.2)
Total Protein: 7.6 g/dL (ref 6.0–8.3)

## 2012-05-26 LAB — IRON: Iron: 10 ug/dL — ABNORMAL LOW (ref 42–145)

## 2012-05-26 MED ORDER — AMLODIPINE BESYLATE 5 MG PO TABS: 5.0000 mg | ORAL_TABLET | Freq: Every day | ORAL | Status: DC

## 2012-05-26 MED ORDER — NORETHINDRONE 0.35 MG PO TABS: 1.0000 | ORAL_TABLET | Freq: Every day | ORAL | Status: DC

## 2012-05-26 MED ORDER — METHYLDOPA 500 MG PO TABS: 500.0000 mg | ORAL_TABLET | Freq: Three times a day (TID) | ORAL | Status: DC

## 2012-05-26 NOTE — Progress Notes (Signed)
[address]   [place], [place]  [address]   [phone]  Subjective:    Patient ID: Tara Briggs, female    DOB: Mar 02, 1971, 41 y.o.   MRN: [id number]  HPI This 41 y.o. female presents for evaluation for the following:  1.  HTN:  Presenting for follow-up.  Moved away for two years; has recently moved back to [place] from [facility].  Amlodipine 5mg  one daily; Aldomet one three times per day; but taking one bid.  No chest pain, shortness of breath, palpitations, leg swelling.  No headaches, dizziness.  Home readings 128/70-138/84.  No medication today; ran out yesterday.  Good tolerance to treatment; good symptom control.    2.  PCOS: chronic history of PCOS with infertility. Followed by GYN.   3.  Irregular menses:  No longer desires pregnancy. Interested in OCP to regulate menses.  Gyn is [place].  Last visit August 2013.  S/p pelvic ultrasound to evaluate ovaries; has Fransico Michael tumors.  Wants to undergo procedure to shed down ovaries.  Called gyn yesterday; played phone tag with his nurse.  Awaiting return call.  Currently bleeding now; bleeding for one week; recently had menses for two weeks.  Menses are monthly but will have intermenstrual bleeding for past several months.   S/p endometrial biopsy by gyn in 2012.  Also underwent endometrial biopsy by [facility].  Gyn not concerned with bleeding but patient becoming very frustrated with frequent bleeding.    4. Anemia iron deficiency: s/p admission over the summer at [facility] for Hgb/anemia of 7.5; s/p 1 unit PRBC; Hgb 7.5.  Due for repeat labs.  Continues to have frequent irregular menses.    PMH:  HTN, PCOS with irregular menses and infertility, Anemia iron deficiency, allergic rhinitis PSurg: R knee surgery age 89.   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  No exercise.  Family: M-living at age 46; HTN.   F-living with HTN, COPD, ESRD.  Siblings Half: unknown.      Review of Systems  Constitutional: Negative for fever, chills, diaphoresis and fatigue.  Respiratory: Negative for choking, shortness of breath, wheezing and stridor.   Cardiovascular: Negative for chest pain, palpitations and leg swelling.  Genitourinary: Positive for vaginal bleeding and menstrual problem. Negative for vaginal discharge.  Neurological: Negative for dizziness, syncope, facial asymmetry, weakness, light-headedness, numbness and headaches.    Past Medical History  Diagnosis Date  . Hypertension   . Polycystic ovary disease   . Seasonal allergies   . Anemia   . Infertility, female     Past Surgical History  Procedure Date  . Knee surgery     Prior to Admission medications   Medication Sig Start Date End Date Taking? Authorizing Provider  amLODipine (NORVASC) 5 MG tablet Take 1 tablet (5 mg total) by mouth daily. 05/26/12 05/26/13 Yes Ethelda Chick, MD  calcium gluconate 500 MG tablet Take 500 mg by mouth daily.   Yes Historical Provider, MD  cetirizine (ZYRTEC) 10 MG tablet Take 10 mg by mouth daily.   Yes Historical Provider, MD  cholecalciferol (VITAMIN D) 1000 UNITS tablet Take 1,000 Units by mouth daily.   Yes Historical Provider, MD  ferrous sulfate 325 (65 FE) MG tablet Take 325 mg by mouth daily with breakfast.   Yes  Historical Provider, MD  fluticasone (FLONASE) 50 MCG/ACT nasal spray Place 2 sprays into the nose daily. 02/02/12 02/01/13 Yes Luiz Blare, MD  metFORMIN (GLUCOPHAGE) 1000 MG tablet Take 1 tablet (1,000 mg total) by mouth 2 (two) times daily with a meal. 03/07/12  Yes Leroy Sea, MD  methyldopa (ALDOMET) 500 MG tablet Take 1 tablet (500 mg total) by mouth 3 (three) times daily. 05/26/12  Yes Ethelda Chick, MD  Multiple Vitamin (MULTIVITAMIN WITH MINERALS) TABS Take 1 tablet  by mouth daily.   Yes Historical Provider, MD  vitamin C (ASCORBIC ACID) 500 MG tablet Take 500 mg by mouth daily.   Yes Historical Provider, MD  norethindrone (ORTHO MICRONOR) 0.35 MG tablet Take 1 tablet (0.35 mg total) by mouth daily. 05/26/12   Ethelda Chick, MD  pantoprazole (PROTONIX) 40 MG tablet Take 1 tablet (40 mg total) by mouth daily. 03/04/12 03/04/13  Leroy Sea, MD    No Known Allergies  History   Social History  . Marital Status: Single    Spouse Name: N/A    Number of Children: N/A  . Years of Education: N/A   Occupational History  . Not on file.   Social History Main Topics  . Smoking status: Current Every Day Smoker -- 1.0 packs/day  . Smokeless tobacco: Never Used  . Alcohol Use: 4.8 oz/week    8 Cans of beer per week  . Drug Use: No  . Sexually Active: Yes    Birth Control/ Protection: None   Other Topics Concern  . Not on file   Social History Narrative   Marital status:  Separated after 7 years of marriage; dating.   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  Tobacco: 1/2 ppd   Alcohol: beer on weekends.   Drugs: none   Exercise: none    Family History  Problem Relation Age of Onset  . Hypertension Mother   . COPD Father   . Hypertension Father   . Kidney disease Father        Objective:   Physical Exam  Nursing note and vitals reviewed. Constitutional: She is oriented to person, place, and time. She appears well-developed and well-nourished. No distress.  HENT:  Head: Normocephalic and atraumatic.  Right Ear: External ear normal.  Left Ear: External ear normal.  Nose: Nose normal.  Mouth/Throat: Oropharynx is clear and moist.  Eyes: Conjunctivae normal are normal. Pupils are equal, round, and reactive to light.  Neck: Normal range of motion. Neck supple. No JVD present. No thyromegaly present.  Cardiovascular: Normal rate, regular rhythm, normal heart sounds and intact distal pulses.   Exam reveals no gallop and no friction rub.   No murmur heard. Pulmonary/Chest: Effort normal and breath sounds normal. No respiratory distress. She has no wheezes. She has no rales.  Abdominal: Soft. Bowel sounds are normal. She exhibits no distension and no mass. There is no tenderness. There is no rebound and no guarding.  Lymphadenopathy:    She has no cervical adenopathy.  Neurological: She is alert and oriented to person, place, and time. No cranial nerve deficit. She exhibits normal muscle tone. Coordination normal.  Skin: Skin is warm and dry. She is not diaphoretic.  Psychiatric: She has a normal mood and affect. Her behavior is normal. Judgment and thought content normal.      Results for orders placed in visit on 05/26/12  POCT CBC  Component Value Range   WBC 5.3  4.6 - 10.2 K/uL   Lymph, poc 2.3  0.6 - 3.4   POC LYMPH PERCENT 44.0  10 - 50 %L   MID (cbc) 0.3  0 - 0.9   POC MID % 6.6  0 - 12 %M   POC Granulocyte 2.6  2 - 6.9   Granulocyte percent 49.4  37 - 80 %G   RBC 4.06  4.04 - 5.48 M/uL   Hemoglobin 10.3 (*) 12.2 - 16.2 g/dL   HCT, POC 47.8  29.5 - 47.9 %   MCV 85.8  80 - 97 fL   MCH, POC 25.4 (*) 27 - 31.2 pg   MCHC 29.6 (*) 31.8 - 35.4 g/dL   RDW, POC 62.1     Platelet Count, POC 402  142 - 424 K/uL   MPV 7.9  0 - 99.8 fL  POCT UA - MICROSCOPIC ONLY      Component Value Range   WBC, Ur, HPF, POC 3-5     RBC, urine, microscopic tntc     Bacteria, U Microscopic 1+     Mucus, UA neg     Epithelial cells, urine per micros 0-3     Crystals, Ur, HPF, POC neg     Casts, Ur, LPF, POC neg     Yeast, UA neg    POCT URINE PREGNANCY      Component Value Range   Preg Test, Ur Negative    POCT URINALYSIS DIPSTICK      Component Value Range   Color, UA yellow     Clarity, UA clear     Glucose, UA neg     Bilirubin, UA neg     Ketones, UA trace     Spec Grav, UA 1.025     Blood, UA large     pH, UA 5.5     Protein, UA neg     Urobilinogen, UA 0.2      Nitrite, UA neg     Leukocytes, UA Negative      EKG: NSR.    Assessment & Plan:   1. Essential hypertension, benign  POCT UA - Microscopic Only, POCT urinalysis dipstick, Comprehensive metabolic panel, TSH, EKG 12-Lead, amLODipine (NORVASC) 5 MG tablet, methyldopa (ALDOMET) 500 MG tablet  2. Iron deficiency anemia, unspecified  POCT CBC, Iron, IBC Panel  3. Vaginal bleeding, abnormal  POCT urine pregnancy, TSH, norethindrone (ORTHO MICRONOR) 0.35 MG tablet     1.  HTN: uncontrolled due to non-compliance with medication.  Refills provided for six months.  Can switch Aldomet to other antihypertensive agent when has good contraception plan.  No changes made today.  Obtain labs.  RTC six months. 2.  Iron deficiency anemia:  New yet improving.  S/p admission warranting transfusion in 02/2012.  Obtain labs.  Needs decreased frequency of menses with severe anemia; recommend Mirena IUD insertion by gyn to develop amenorrhea. 3.  Vaginal bleeding irregular: New to this provider but ongoing issue for patient.  S/p endometrial biopsy x 2 per patient's report.  Recommend follow-up with gyn to address further.  Rx for Micronor provided today until gyn visit; OCP contraindicated due to smoking with age>35. 4.  Tobacco abuse: counseling provided; encouraged cessation.  OCP contraindicated due to smoking and age>35.

## 2012-05-26 NOTE — Patient Instructions (Addendum)
1. Essential hypertension, benign  POCT UA - Microscopic Only, POCT urinalysis dipstick, Comprehensive metabolic panel, TSH, EKG 12-Lead, amLODipine (NORVASC) 5 MG tablet, methyldopa (ALDOMET) 500 MG tablet  2. Iron deficiency anemia, unspecified  POCT CBC, Iron, IBC Panel  3. Vaginal bleeding, abnormal  POCT urine pregnancy, TSH, norethindrone (ORTHO MICRONOR) 0.35 MG tablet    FOLLOW-UP IN SIX MONTHS FOR BLOOD PRESSURE FOLLOW-UP.  CALL GYN FOR APPOINTMENT TO DISCUSS IRREGULAR VAGINAL BLEEDING FURTHER.

## 2012-05-26 NOTE — Telephone Encounter (Signed)
Per Dr. Katrinka Blazing, clld Amlodipine 5 mg 1 tablet po qd #30 w/ 5 refills and Aldomet 500 mg 1 tablet po TID #90 w/5 refills into [facility],pharmacist at [facility] [phone].

## 2012-05-28 ENCOUNTER — Encounter: Payer: Self-pay | Admitting: Family Medicine

## 2012-05-31 ENCOUNTER — Encounter: Payer: Self-pay | Admitting: Radiology

## 2012-06-03 ENCOUNTER — Encounter: Payer: Self-pay | Admitting: *Deleted

## 2012-06-26 ENCOUNTER — Telehealth: Payer: Self-pay

## 2012-06-26 NOTE — Telephone Encounter (Signed)
PT STATES SHE WAS PUT ON BCP'S TO REGULATE HER CYCLE, BUT THEY DON'T SEEM TO BE WORKING. WOULD LIKE TO SPEAK WITH SOMEONE ABOUT IT PLEASE CALL [phone] OR [phone]

## 2012-06-26 NOTE — Telephone Encounter (Signed)
Vaginal bleeding irregular: New to this provider but ongoing issue for patient. S/p endometrial biopsy x 2 per patient's report. Recommend follow-up with gyn to address further.  Called patient, advised may take up to 6 months for this to regulate. She indicates she is having very heavy periods, so she has been advised to proceed with appt with her Gyn.

## 2012-09-10 ENCOUNTER — Telehealth: Payer: Self-pay

## 2012-09-10 NOTE — Telephone Encounter (Signed)
Left message for her to call back, do not see this medication in her list.

## 2012-09-10 NOTE — Telephone Encounter (Signed)
I'm sorry but we cannot restart this medication for her without first evaluating her. Please have her come in to be seen.

## 2012-09-10 NOTE — Telephone Encounter (Signed)
PT STATES SHE IS IN NEED OF HER WELLBUTRIN. STATES THE PHARMACY TOLD HER TO CALL us BECAUSE HER RX HAD EXPIRED WITH THEM PLEASE CALL [phone]    [facility] [address]

## 2012-09-10 NOTE — Telephone Encounter (Signed)
Pulled chart. Patient was on Wellbutrin XL 100mg  bid, but this was in 2011. Please advise, I think she needs follow up. Chart in PA pool area. It does not look like patient has gotten this from our office since 01/2010

## 2012-09-11 NOTE — Telephone Encounter (Signed)
Called patient to advise. Left message for her to call me back  

## 2012-09-13 NOTE — Telephone Encounter (Signed)
Spoke with pt advised to RTC. Pt seemed upset but understood.

## 2013-04-10 ENCOUNTER — Encounter: Payer: Self-pay | Admitting: Medical

## 2013-05-08 ENCOUNTER — Ambulatory Visit (INDEPENDENT_AMBULATORY_CARE_PROVIDER_SITE_OTHER): Payer: No Typology Code available for payment source | Admitting: Medical

## 2013-05-08 ENCOUNTER — Telehealth: Payer: Self-pay | Admitting: Internal Medicine

## 2013-05-08 ENCOUNTER — Encounter: Payer: Self-pay | Admitting: Medical

## 2013-05-08 VITALS — BP 120/80 | HR 68 | Temp 98.1°F | Resp 16 | Ht 64.0 in | Wt 155.0 lb

## 2013-05-08 DIAGNOSIS — Z1239 Encounter for other screening for malignant neoplasm of breast: Secondary | ICD-10-CM

## 2013-05-08 DIAGNOSIS — Z0000 Encounter for general adult medical examination without abnormal findings: Secondary | ICD-10-CM

## 2013-05-08 DIAGNOSIS — D279 Benign neoplasm of unspecified ovary: Secondary | ICD-10-CM

## 2013-05-08 DIAGNOSIS — Z8249 Family history of ischemic heart disease and other diseases of the circulatory system: Secondary | ICD-10-CM

## 2013-05-08 DIAGNOSIS — Z113 Encounter for screening for infections with a predominantly sexual mode of transmission: Secondary | ICD-10-CM

## 2013-05-08 DIAGNOSIS — I10 Essential (primary) hypertension: Secondary | ICD-10-CM

## 2013-05-08 DIAGNOSIS — Z23 Encounter for immunization: Secondary | ICD-10-CM

## 2013-05-08 DIAGNOSIS — E282 Polycystic ovarian syndrome: Secondary | ICD-10-CM

## 2013-05-08 DIAGNOSIS — F17200 Nicotine dependence, unspecified, uncomplicated: Secondary | ICD-10-CM

## 2013-05-08 DIAGNOSIS — R011 Cardiac murmur, unspecified: Secondary | ICD-10-CM

## 2013-05-08 LAB — CBC WITH DIFFERENTIAL/PLATELET
Basophils Absolute: 0 10*3/uL (ref 0.0–0.1)
Basophils Relative: 1 % (ref 0–1)
HCT: 34.9 % — ABNORMAL LOW (ref 36.0–46.0)
Hemoglobin: 10.9 g/dL — ABNORMAL LOW (ref 12.0–15.0)
Lymphocytes Relative: 38 % (ref 12–46)
Monocytes Absolute: 0.4 10*3/uL (ref 0.1–1.0)
Monocytes Relative: 10 % (ref 3–12)
Neutro Abs: 1.9 10*3/uL (ref 1.7–7.7)
Neutrophils Relative %: 49 % (ref 43–77)
Platelets: 451 10*3/uL — ABNORMAL HIGH (ref 150–400)
RBC: 4.22 MIL/uL (ref 3.87–5.11)
RDW: 20.3 % — ABNORMAL HIGH (ref 11.5–15.5)
WBC: 3.9 10*3/uL — ABNORMAL LOW (ref 4.0–10.5)

## 2013-05-08 LAB — POCT URINALYSIS DIPSTICK
Bilirubin, UA: NEGATIVE
Glucose, UA: NEGATIVE
Ketones, UA: NEGATIVE
Leukocytes, UA: NEGATIVE
pH, UA: 6

## 2013-05-08 LAB — COMPREHENSIVE METABOLIC PANEL
Albumin: 4.5 g/dL (ref 3.5–5.2)
Alkaline Phosphatase: 47 U/L (ref 39–117)
BUN: 11 mg/dL (ref 6–23)
CO2: 27 mEq/L (ref 19–32)
Calcium: 10.4 mg/dL (ref 8.4–10.5)
Chloride: 102 mEq/L (ref 96–112)
Creat: 0.76 mg/dL (ref 0.50–1.10)
Glucose, Bld: 76 mg/dL (ref 70–99)
Potassium: 4.6 mEq/L (ref 3.5–5.3)
Total Protein: 7.8 g/dL (ref 6.0–8.3)

## 2013-05-08 LAB — HIV ANTIBODY (ROUTINE TESTING W REFLEX): HIV: NONREACTIVE

## 2013-05-08 LAB — RPR

## 2013-05-08 MED ORDER — BUPROPION HCL ER (XL) 150 MG PO TB24: 150.0000 mg | ORAL_TABLET | Freq: Every day | ORAL | Status: AC

## 2013-05-08 MED ORDER — AMLODIPINE BESYLATE 5 MG PO TABS: 5.0000 mg | ORAL_TABLET | Freq: Every day | ORAL | Status: DC

## 2013-05-08 MED ORDER — METHYLDOPA 500 MG PO TABS: 500.0000 mg | ORAL_TABLET | Freq: Three times a day (TID) | ORAL | Status: DC

## 2013-05-08 NOTE — Telephone Encounter (Signed)
meds were printed instead of e-scribed. Sent in meds to pharmacy

## 2013-05-08 NOTE — Progress Notes (Addendum)
Subjective:   HPI  Tara Briggs is a 42 y.o. female who presents for a complete physical.  New patient today.  She works as an Public house manager.  Preventative care: Last ophthalmology visit: yes- 03/2013-Dr. MacFarland Last dental visit:yes- Dr. Maple Hudson Last colonoscopy:n/a Last mammogram:never Last gynecological exam:05/2012 Last EKG:05/2012 Last labs:05/2012  Prior vaccinations: TD or Tdap:05/07/13 Influenza:05/07/13 Pneumococcal:n/a Shingles/Zostavax:n/a  Advanced directive:n/a Health care power of attorney:n/a Living will:n/a  Concerns: Tobacco use-she would like to stop tobacco, has used Wellbutrin and Chantix prior.  Did well with Wellbutrin in the past and would like to try this again  She has a history of hypertension, compliant with medicine, needs refills. She has been on both Norvasc and Aldomet for years.  She has a history of PCOS.  Takes Femara and metformin for this.  She has been followed by a reproductive specialist at [facility], last visit over a year ago  Reviewed their medical, surgical, family, social, medication, and allergy history and updated chart as appropriate.  Past Medical History  Diagnosis Date  . Hypertension   . Polycystic ovary disease   . Infertility, female   . Seasonal allergies   . Anemia     blood loss due to heavy periods prior  . Blood transfusion without reported diagnosis 2013  . Brenner tumor of ovary   . Wears glasses     Past Surgical History  Procedure Laterality Date  . Knee surgery      age 74yo, surgery to remove foreign body  . Cyst excision      left neck    History   Social History  . Marital Status: Single    Spouse Name: N/A    Number of Children: N/A  . Years of Education: N/A   Occupational History  . Not on file.   Social History Main Topics  . Smoking status: Current Every Day Smoker -- 0.25 packs/day  . Smokeless tobacco: Never Used  . Alcohol Use: No  . Drug Use: No  . Sexual  Activity: Yes    Birth Control/ Protection: None   Other Topics Concern  . Not on file   Social History Narrative   Marital status:  Divorced, current partner of 2 years.      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     Exercise - walks regularly, physical active at work    Family History  Problem Relation Age of Onset  . Hypertension Mother   . Stroke Mother   . Heart disease Mother 68  . COPD Father   . Hypertension Father   . Kidney disease Father     tranplant, renal disease due to HTN  . Alcohol abuse Father   . Diabetes Father   . Cancer Paternal Grandmother     breast    Current outpatient prescriptions:calcium gluconate 500 MG tablet, Take 500 mg by mouth daily., Disp: , Rfl: ;  cetirizine (ZYRTEC) 10 MG tablet, Take 10 mg by mouth daily., Disp: , Rfl: ;  cholecalciferol (VITAMIN D) 1000 UNITS tablet, Take 1,000 Units by mouth daily., Disp: , Rfl: ;  ferrous sulfate 325 (65 FE) MG tablet, Take 325 mg by mouth daily with breakfast., Disp: , Rfl:  letrozole (FEMARA) 2.5 MG tablet, Take 5 mg by mouth daily., Disp: , Rfl: ;  metFORMIN (GLUCOPHAGE) 1000 MG tablet, Take 1 tablet (1,000 mg total) by mouth 2 (two) times daily with a  meal., Disp: , Rfl: ;  Multiple Vitamin (MULTIVITAMIN WITH MINERALS) TABS, Take 1 tablet by mouth daily., Disp: , Rfl: ;  vitamin C (ASCORBIC ACID) 500 MG tablet, Take 500 mg by mouth daily., Disp: , Rfl:  amLODipine (NORVASC) 5 MG tablet, Take 1 tablet (5 mg total) by mouth daily., Disp: 30 tablet, Rfl: 5;  buPROPion (WELLBUTRIN XL) 150 MG 24 hr tablet, Take 1 tablet (150 mg total) by mouth daily., Disp: 30 tablet, Rfl: 1;  fluticasone (FLONASE) 50 MCG/ACT nasal spray, Place 2 sprays into the nose daily., Disp: 16 g, Rfl: 0;  methyldopa (ALDOMET) 500 MG tablet, Take 1 tablet (500 mg total) by mouth 3 (three) times daily., Disp: 90 tablet, Rfl: 5 pantoprazole (PROTONIX) 40 MG tablet, Take 1 tablet (40 mg total) by mouth  daily., Disp: 30 tablet, Rfl: 0  No Known Allergies     Review of Systems Constitutional: -fever, -chills, -sweats, -unexpected weight change, -decreased appetite, -fatigue Allergy: -sneezing, -itching, -congestion Dermatology: -changing moles, --rash, -lumps ENT: -runny nose, -ear pain, -sore throat, -hoarseness, -sinus pain, -teeth pain, - ringing in ears, -hearing loss, -nosebleeds Cardiology: -chest pain, -palpitations, -swelling, -difficulty breathing when lying flat, -waking up short of breath Respiratory: -cough, -shortness of breath, -difficulty breathing with exercise or exertion, -wheezing, -coughing up blood Gastroenterology: -abdominal pain, -nausea, -vomiting, -diarrhea, -constipation, -blood in stool, -changes in bowel movement, -difficulty swallowing or eating Hematology: -bleeding, -bruising  Musculoskeletal: -joint aches, -muscle aches, -joint swelling, -back pain, -neck pain, -cramping, -changes in gait Ophthalmology: denies vision changes, eye redness, itching, discharge Urology: -burning with urination, -difficulty urinating, -blood in urine, -urinary frequency, -urgency, -incontinence Neurology: -headache, -weakness, -tingling, -numbness, -memory loss, -falls, -dizziness Psychology: -depressed mood, -agitation, -sleep problems     Objective:   Physical Exam  BP 120/80  Pulse 68  Temp(Src) 98.1 F (36.7 C) (Oral)  Resp 16  Ht 5\' 4"  (1.626 m)  Wt 155 lb (70.308 kg)  BMI 26.59 kg/m2  General appearance: alert, no distress, WD/WN, AA female Skin: left neck with surgical scar from prior cyst excision, few scattered benign macules, othewrise no worrisome lesions HEENT: normocephalic, conjunctiva/corneas normal, sclerae anicteric, PERRLA, EOMi, nares patent, no discharge or erythema, pharynx normal Oral cavity: MMM, tongue normal, teeth in good repair  Neck: supple, no lymphadenopathy, no thyromegaly, no masses, normal ROM, no JVD, no bruits Chest: non tender,  normal shape and expansion Heart: Faint 1-2/6 brief systolic murmur heard best in the upper sternal borders, otherwise RRR, normal S2 Lungs: CTA bilaterally, no wheezes, rhonchi, or rales Abdomen: +bs, soft, non tender, non distended, no masses, no hepatomegaly, no splenomegaly, no bruits Back: non tender, normal ROM, no scoliosis Musculoskeletal: upper extremities non tender, no obvious deformity, normal ROM throughout, lower extremities non tender, no obvious deformity, normal ROM throughout Extremities: no edema, no cyanosis, no clubbing Pulses: 2+ symmetric, upper and lower extremities, normal cap refill Neurological: alert, oriented x 3, CN2-12 intact, strength normal upper extremities and lower extremities, sensation normal throughout, DTRs 2+ throughout, no cerebellar signs, gait normal Psychiatric: normal affect, behavior normal, pleasant  Breast/gyn/rectal - deferred to gynecology   Adult ECG Report  Indication: HTN, new murmur  Rate: 66bpm  Rhythm: normal sinus rhythm  QRS Axis: 42 degrees  PR Interval:  QRS Duration: 90ms  QTc:  Conduction Disturbances: none  Other Abnormalities: none  Patient's cardiac risk factors are: hypertension and smoking/ tobacco exposure.  EKG comparison: 10/13   Narrative Interpretation: normal EKG, no changes  Assessment and Plan :    Encounter Diagnoses  Name Primary?  . Routine general medical examination at a health care facility Yes  . Need for Tdap vaccination   . Need for prophylactic vaccination and inoculation against influenza   . Essential hypertension, benign   . Undiagnosed cardiac murmurs   . Tobacco use disorder   . Family history of heart disease   . Screen for STD (sexually transmitted disease)   . Screening for breast cancer   . PCOS (polycystic ovarian syndrome)   . Brenner tumor of ovary, unspecified laterality      Physical exam - discussed healthy lifestyle, diet, exercise, preventative care,  vaccinations, and addressed their concerns.  Handout given. Counseled on the Tdap (tetanus, diptheria, and acellular pertussis) vaccine.  Vaccine information sheet given. Tdap vaccine given after consent obtained. Counseled on the influenza virus vaccine.  Vaccine information sheet given.  Influenza vaccine given after consent obtained. Hypertension-controlled on current medication, refills today New cardiac murmur today-in light of the murmur, hypertension, and family history, we will refer for echocardiogram Tobacco use-she is motivated. Begin Wellbutrin, that she contact the 800-QUIT-NOW hotline for counseling, work to wean down and stop tobacco. STD screening today We will set her up for her first screening mammogram She will go back to see her reproductive specialist at [facility] for Pap smear, and recheck on PCOS and the Brenner's tumor Follow-up pending studies

## 2013-05-09 LAB — TSH: TSH: 1.188 u[IU]/mL (ref 0.350–4.500)

## 2013-05-09 LAB — GC/CHLAMYDIA PROBE AMP: GC Probe RNA: NEGATIVE

## 2013-05-10 ENCOUNTER — Telehealth: Payer: Self-pay | Admitting: Family Medicine

## 2013-05-10 NOTE — Telephone Encounter (Signed)
I am referring the patient over to [facility] and they will call her for her appointment. CLS

## 2013-05-10 NOTE — Telephone Encounter (Signed)
Message copied by Janeice Robinson on Fri May 10, 2013  1:00 PM ------      Message from: Jac Canavan      Created: Wed May 08, 2013  9:25 PM       Set her up for a 2-D echocardiogram - DX: hypertension, new murmur, family history of heart disease            Scheduled her a mammogram if you haven't done so            Send a copy of her last abdominal scans as well as my office visit notes to her reproductive specialists at [facility] ------

## 2013-06-01 ENCOUNTER — Other Ambulatory Visit: Payer: Self-pay | Admitting: Family Medicine

## 2013-12-16 ENCOUNTER — Other Ambulatory Visit: Payer: Self-pay | Admitting: Family Medicine

## 2013-12-16 ENCOUNTER — Telehealth: Payer: Self-pay | Admitting: Medical

## 2013-12-16 DIAGNOSIS — R011 Cardiac murmur, unspecified: Secondary | ICD-10-CM

## 2013-12-16 NOTE — Telephone Encounter (Signed)
I received a form for work.  What is this in reference to?  Also, at her physical, was suppose to go for echocardiogram.   I don't believe she went, this was to eval new murmur.

## 2013-12-19 NOTE — Telephone Encounter (Signed)
LM to CB

## 2013-12-20 ENCOUNTER — Telehealth (HOSPITAL_COMMUNITY): Payer: Self-pay | Admitting: *Deleted

## 2013-12-24 NOTE — Telephone Encounter (Signed)
I have tried to contact this patient by phone but she is not returning the phone call so I'm saving this message to the chart. CLS

## 2013-12-24 NOTE — Telephone Encounter (Signed)
Lm on the home number to call back. CLS

## 2013-12-24 NOTE — Telephone Encounter (Signed)
Cell phone voicemail is full. CLS

## 2013-12-31 ENCOUNTER — Telehealth (HOSPITAL_COMMUNITY): Payer: Self-pay | Admitting: *Deleted

## 2014-01-08 ENCOUNTER — Ambulatory Visit (HOSPITAL_COMMUNITY): Payer: Self-pay

## 2014-03-07 IMAGING — US US PELVIS COMPLETE
1 series · 13 of 25 positions shown · non-contrast
Comparison: CT of the abdomen and pelvis 03/03/2012

CLINICAL DATA: Fibroid uterus.  Question of neoplasm.  History of
polycystic ovary disease.

TRANSABDOMINAL AND TRANSVAGINAL ULTRASOUND OF PELVIS
TECHNIQUE: Both transabdominal and transvaginal ultrasound
examinations of the pelvis were performed. Transabdominal technique
was performed for global imaging of the pelvis including uterus,
ovaries, adnexal regions, and pelvic cul-de-sac.
It was necessary to proceed with endovaginal exam following the
transabdominal exam to visualize the uterus and adnexal regions.
LMP 02/25/2012..

[Series 1: us pelvis complete · 0.28mm/px · 13 of 68 slices shown]
[im 1/68]
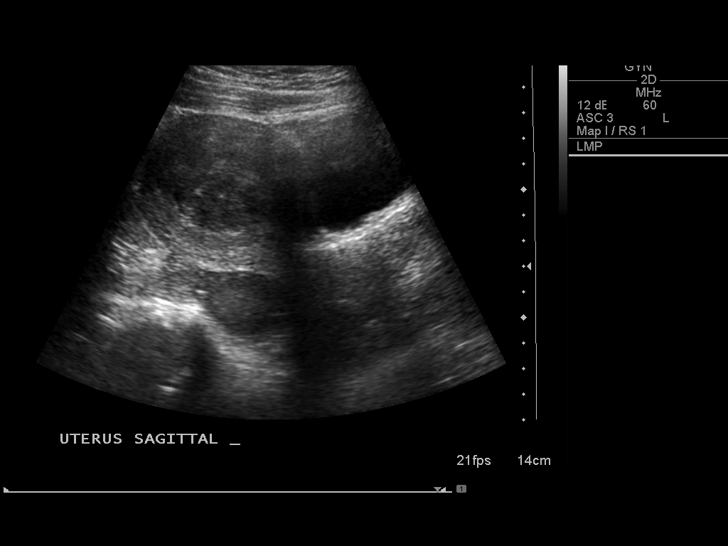
[im 6/68]
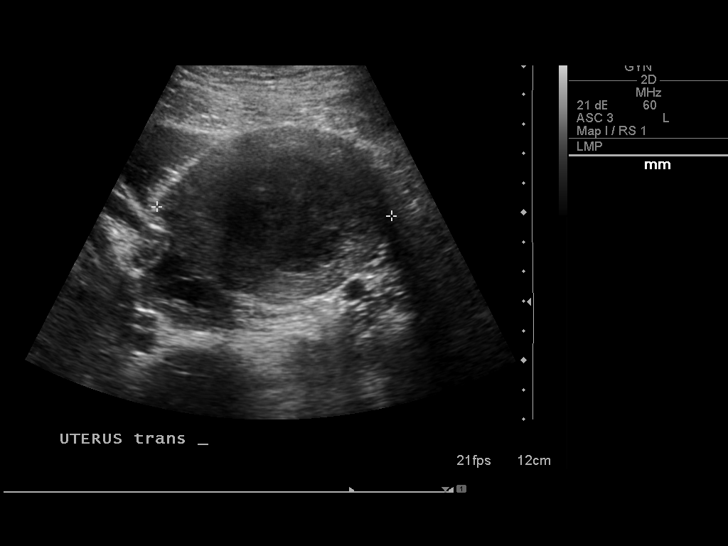
[im 12/68]
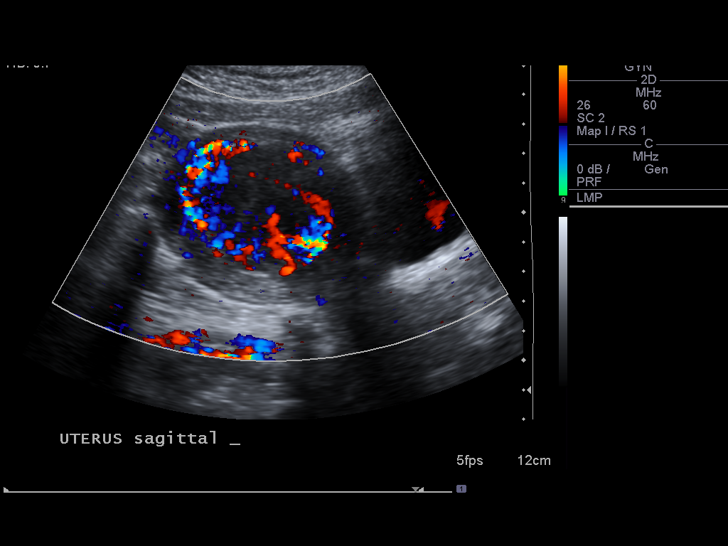
[im 17/68]
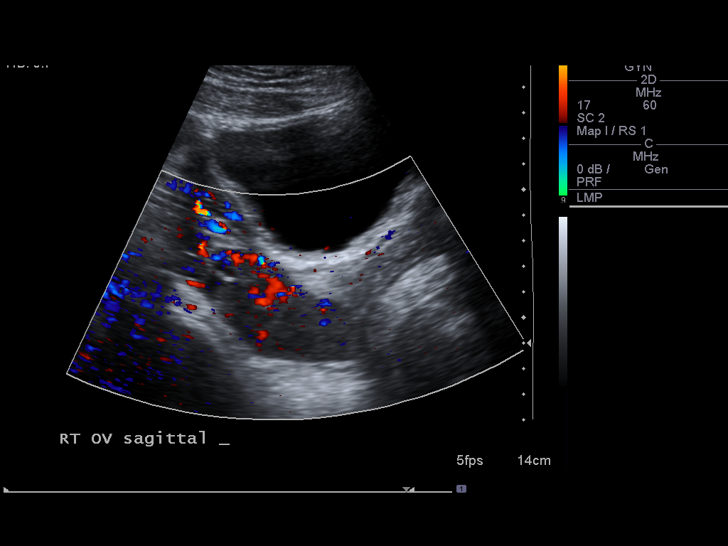
[im 23/68]
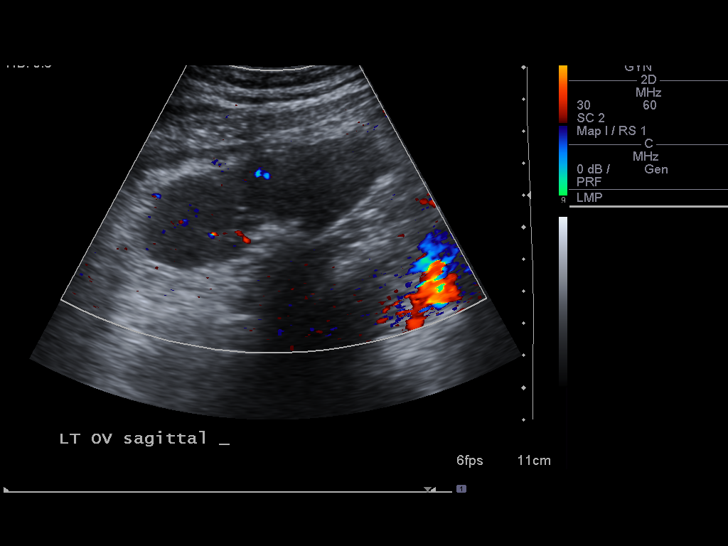
[im 28/68]
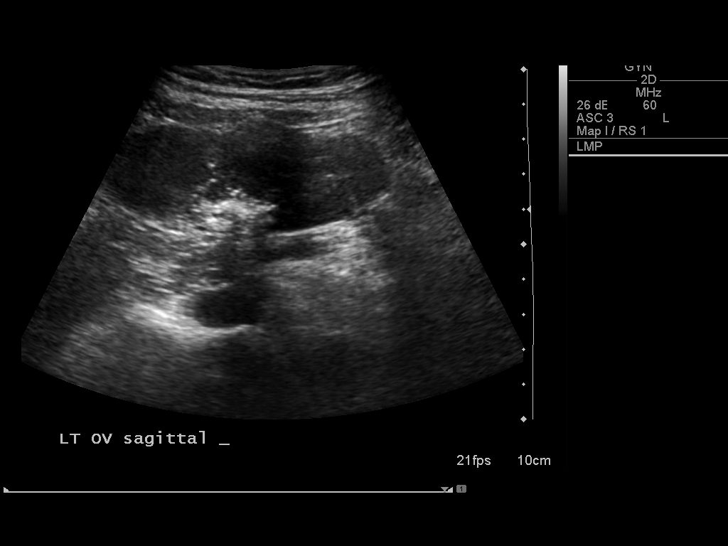
[im 34/68]
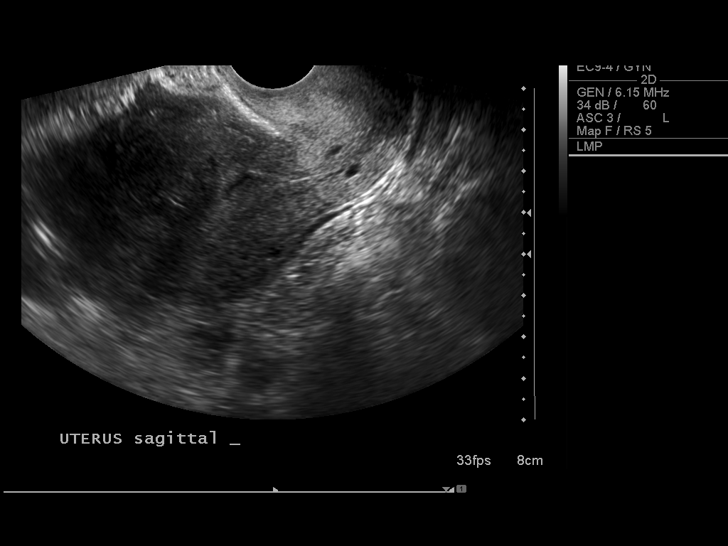
[im 40/68]
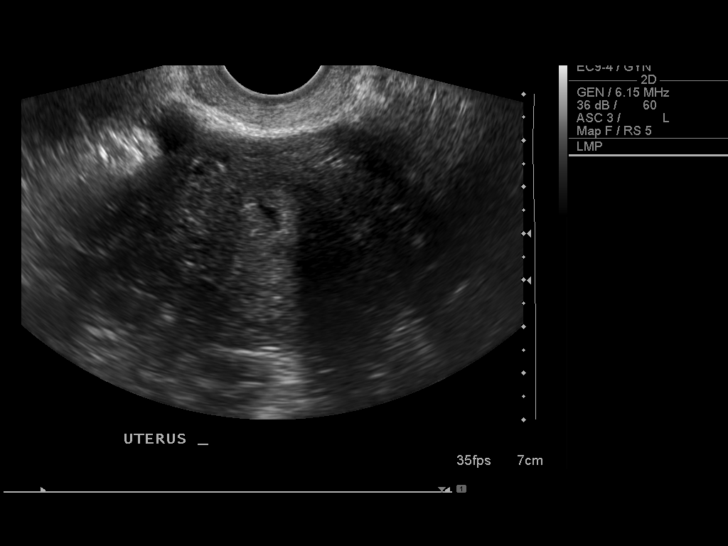
[im 45/68]
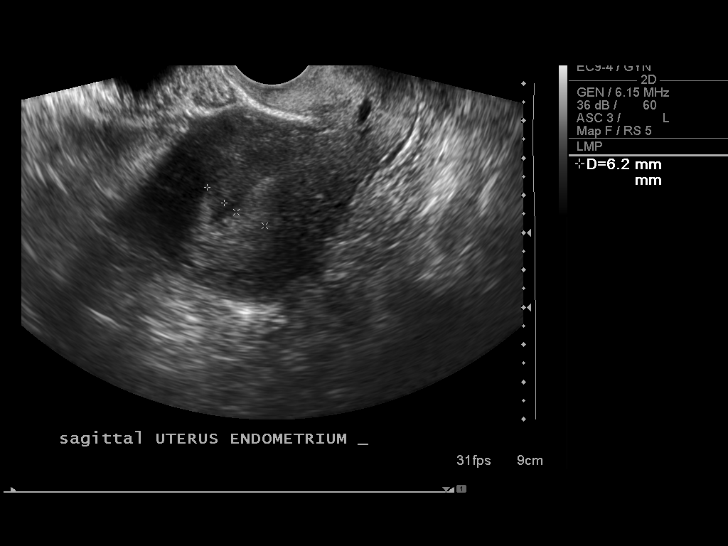
[im 51/68]
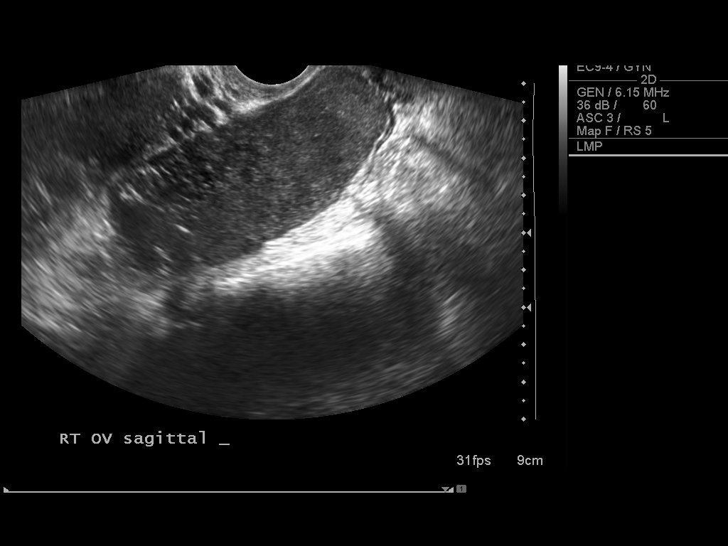
[im 56/68]
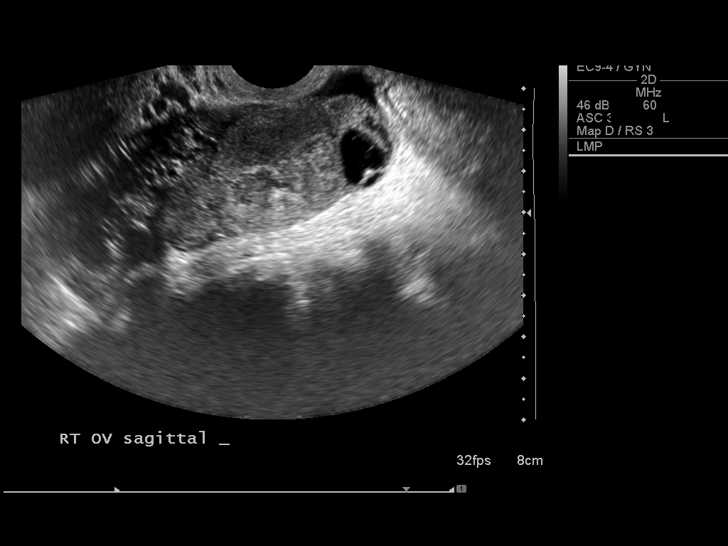
[im 62/68]
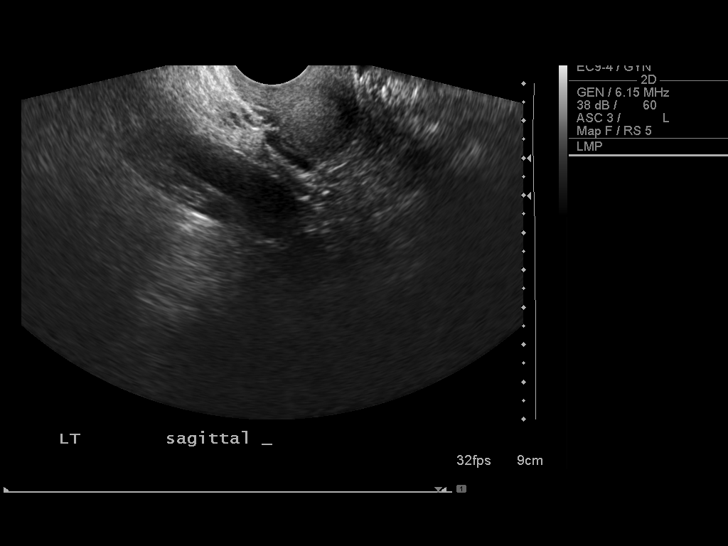
[im 68/68]
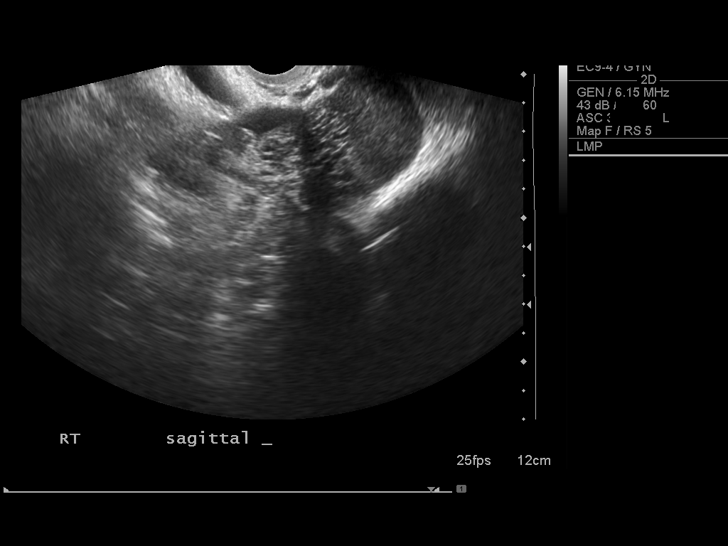

[13 of 25 positions shown; findings below may reference images not displayed]

FINDINGS: Uterus: The uterus is 11. 6 x 6.4 x 8.0 cm.  Fibroid in the fundal
region is 5.7 x 4.8 x 5.1 cm.

Endometrium: The endometrial canal contains a small amount of
fluid.  Endometrium is 14.6 mm in thickness.

Right ovary:  The right ovary is 8.1 x 3.5 x 4.4 cm.  The ovary is
heterogeneous with mixed echogenicity. Majority of the ovary
appears solid.  Few follicles are present.

Left ovary: Left ovary is 8.8 x 3.3 x 4.4 cm.  There is complex
appearance of the left ovary as well with heterogeneous appearance.
Portions of the ovary shadow, suggesting calcification. Ovary is
only seen on transabdominal evaluation. Few if any follicles
identified.

Other findings: There is a trace of free pelvic fluid.
IMPRESSION: 1.  Enlarged uterus, containing a 5.7 cm fibroid.
2.  Enlarged, heterogeneous appearing ovaries bilaterally.  The
appearance would be atypical for polycystic ovary disease.
Neoplasms are not excluded and should be considered. Primary or
metastatic neoplasms are considerations.

The findings were discussed with Dr. Tiger on 03/04/2012 at [DATE]
a.m..  Per discussion with Dr. Shuler, the patient is followed at
[facility] for Freddy Rolando Bertolotti tumor.  Previous pelvic ultrasound
performed in May 2011 at [facility].  Correlation with that
exam is recommended.  If there have been significant changes in
either ovary, tissue diagnosis may be indicated.

## 2014-03-07 IMAGING — CT CT ABD-PELV W/ CM
1 of 3 series · 14 of 32 positions shown, 19 images · IV contrast (OMNIPAQUE 300)
Comparison: None.

CLINICAL DATA: Right-sided abdominal pain.  Nausea, vomiting.
History of hypertension, polycystic ovary disease.

CT ABDOMEN AND PELVIS WITH CONTRAST
TECHNIQUE: Multidetector CT imaging of the abdomen and pelvis was
performed following the standard protocol during bolus
administration of intravenous contrast.
Contrast: 100mL OMNIPAQUE IOHEXOL 300 MG/ML  SOLN

[Series 2: abd/pel with · axial · 0.63mm/px · z∈[+1189,+1559]mm · 14 of 84 slices shown, 19 images]
[im 5/84  soft-tissue]
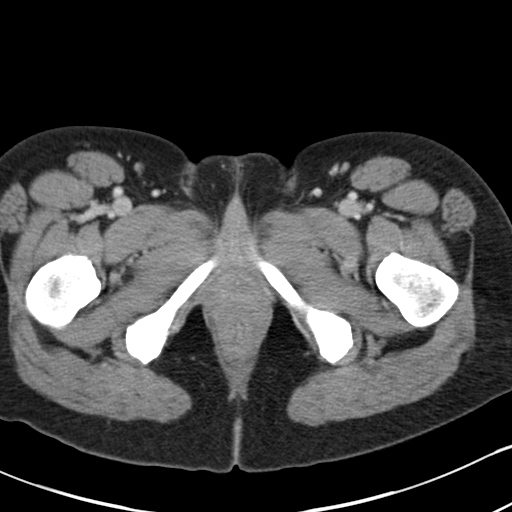
[im 5/84  bone]
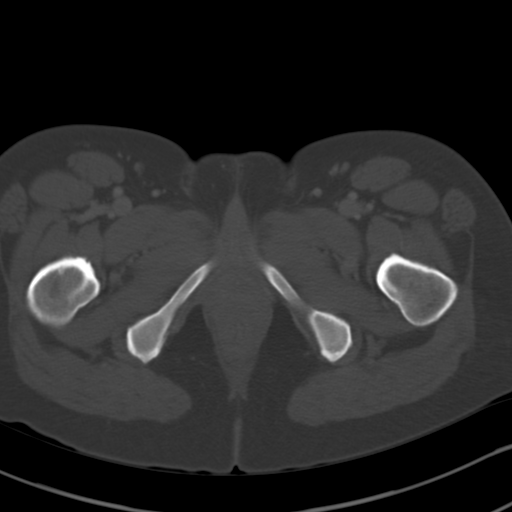
[im 14/84  soft-tissue]
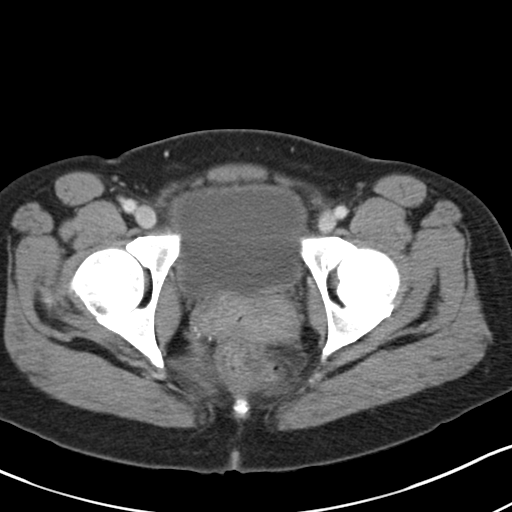
[im 18/84  soft-tissue]
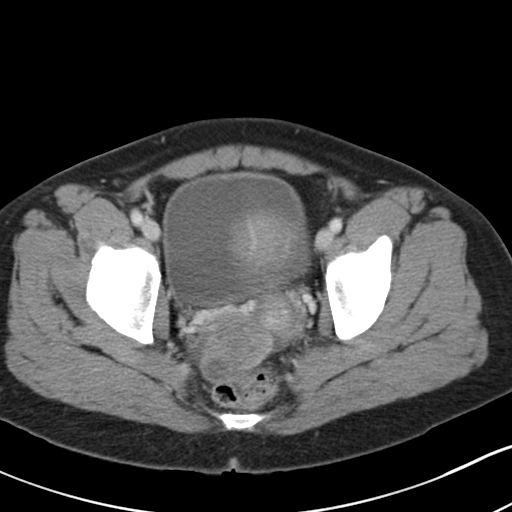
[im 22/84  soft-tissue]
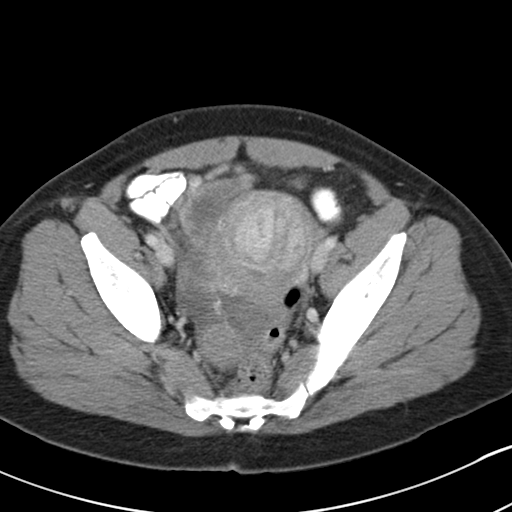
[im 31/84  soft-tissue]
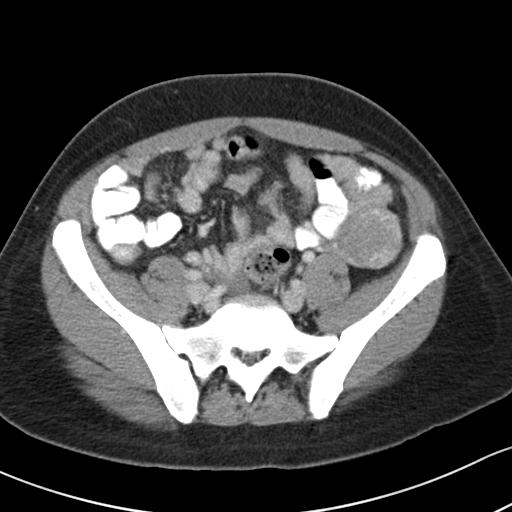
[im 35/84  soft-tissue]
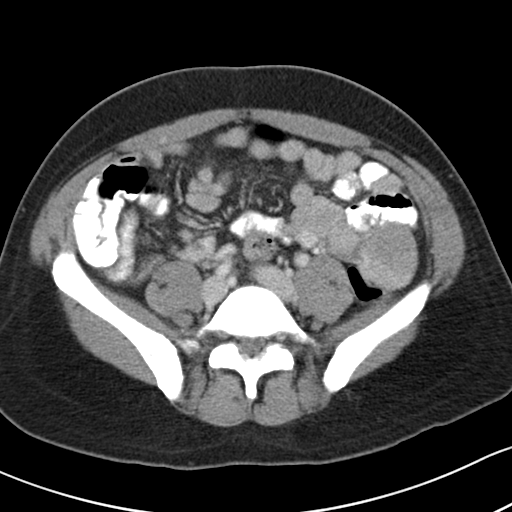
[im 44/84  soft-tissue]
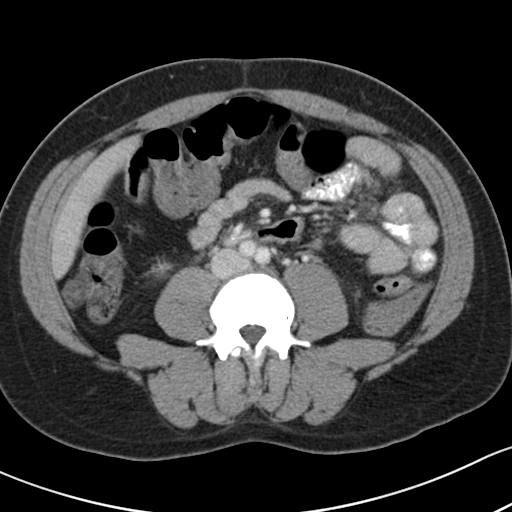
[im 49/84  soft-tissue]
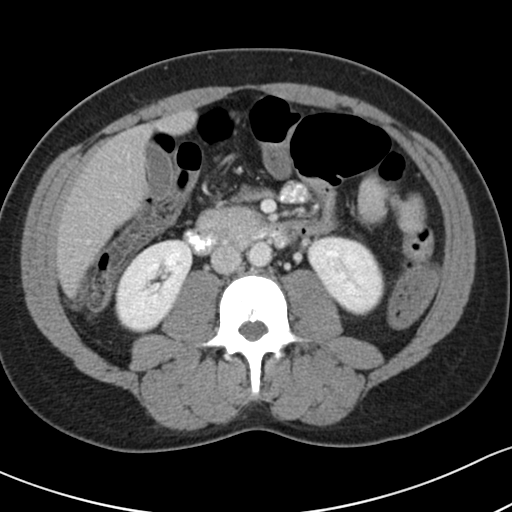
[im 53/84  soft-tissue]
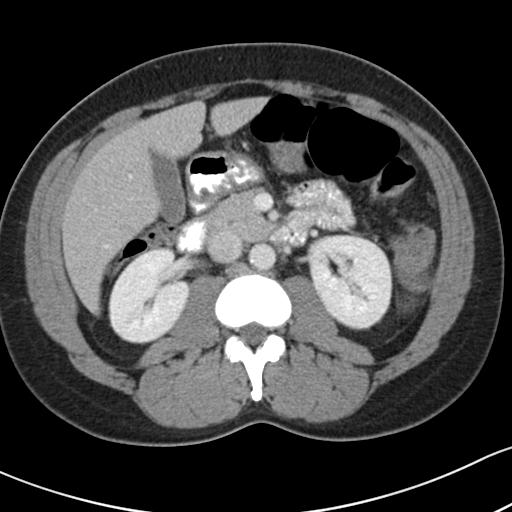
[im 53/84  bone]
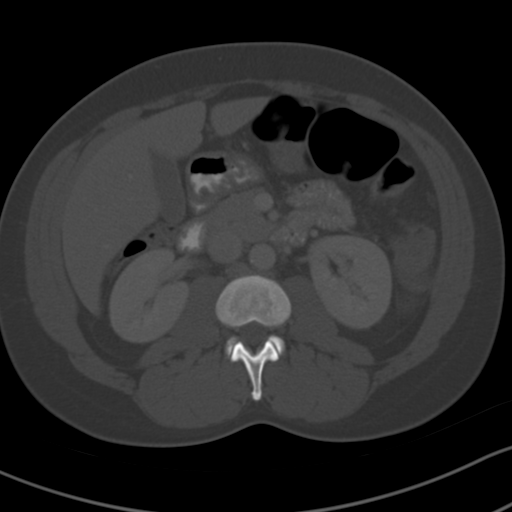
[im 62/84  soft-tissue]
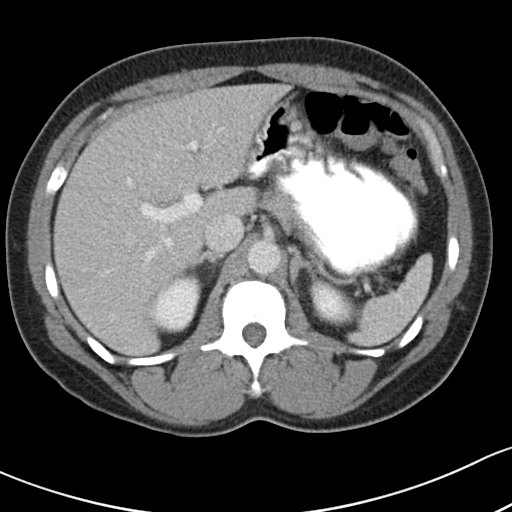
[im 66/84  soft-tissue]
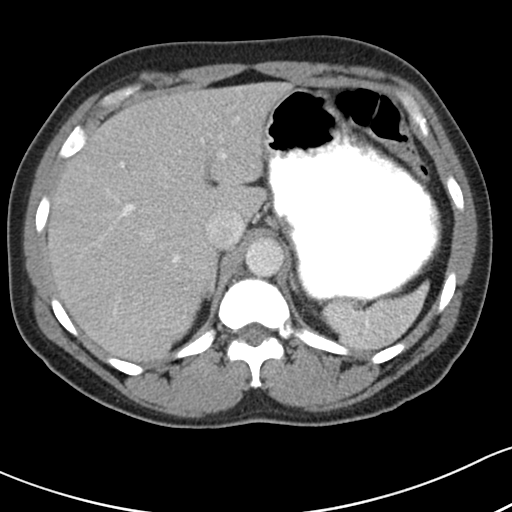
[im 66/84  lung]
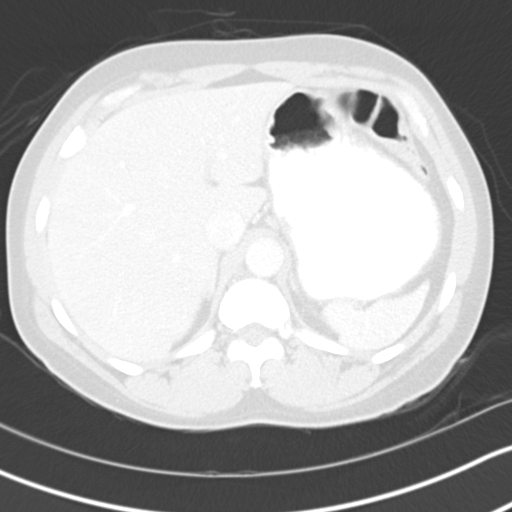
[im 70/84  soft-tissue]
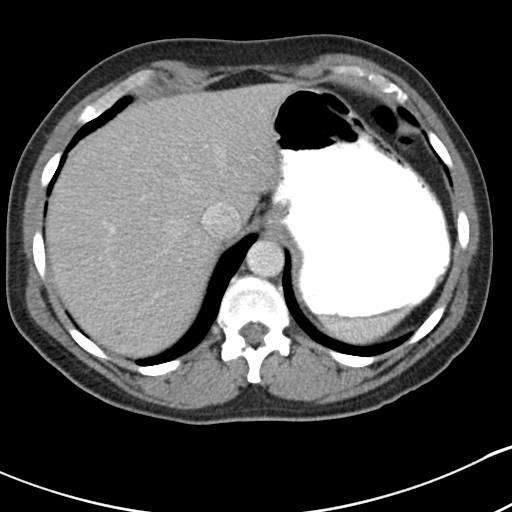
[im 70/84  lung]
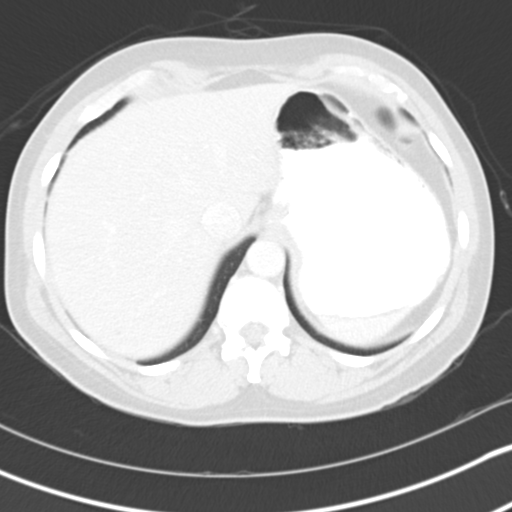
[im 75/84  lung]
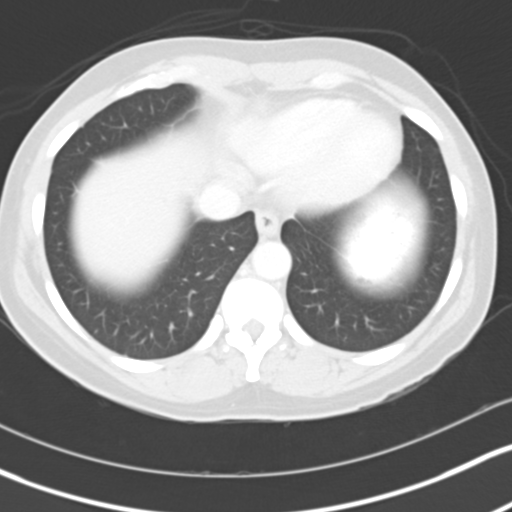
[im 79/84  soft-tissue]
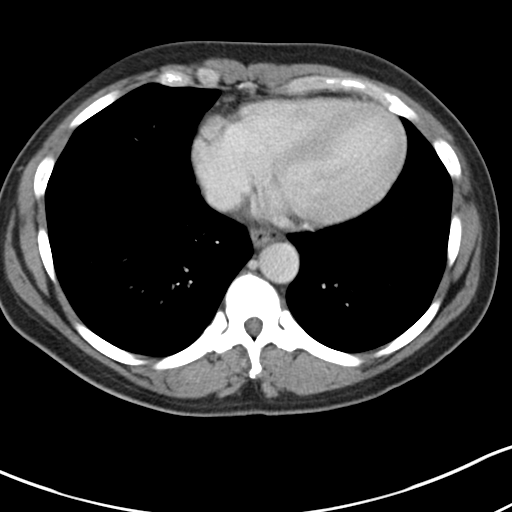
[im 79/84  lung]
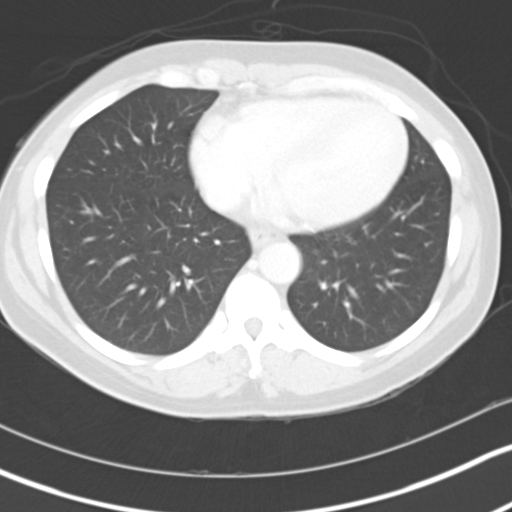

[14 of 32 positions shown; findings below may reference images not displayed]

FINDINGS: Images of the lung bases are unremarkable.  No focal
abnormality identified within the liver, spleen, pancreas, or
kidneys. Gallbladder is present.

The stomach and small bowel loops have a normal appearance. The
appendix is well seen and has a normal appearance.  Colonic loops
are normal in appearance.

The uterus is present and is enlarged and heterogeneous.  A fibroid
measures 5.2 x 5.2 cm, likely in the left uterine fundus.  The
ovaries are enlarged.  Left ovary is estimated to measure 4.5 x
x 3.4 cm.  Within the right adnexa, the right ovary appears more
heterogeneous than the left and measures 10.0 x 4.0 x 5.1 cm.
Further evaluation with pelvic ultrasound is recommended.  Small
amount free pelvic fluid is suspected.  No pelvic adenopathy. No
evidence for aortic aneurysm.
IMPRESSION: 1.  Heterogeneous, enlarged uterus consistent with fibroid in the
fundal region.
2.  Suspect bilateral ovarian enlargement.  Right ovary/adnexal
mass measures approximately 10 cm.  Further evaluation with pelvic
ultrasound is suggested to exclude neoplasm.

## 2014-05-26 ENCOUNTER — Other Ambulatory Visit: Payer: Self-pay | Admitting: Medical

## 2014-06-19 ENCOUNTER — Other Ambulatory Visit: Payer: Self-pay

## 2014-06-19 DIAGNOSIS — Z1231 Encounter for screening mammogram for malignant neoplasm of breast: Secondary | ICD-10-CM

## 2014-07-07 ENCOUNTER — Encounter: Payer: No Typology Code available for payment source | Admitting: Medical

## 2014-07-08 ENCOUNTER — Ambulatory Visit: Payer: No Typology Code available for payment source

## 2014-07-25 ENCOUNTER — Encounter: Payer: Self-pay | Admitting: Medical

## 2014-09-15 ENCOUNTER — Other Ambulatory Visit: Payer: Self-pay | Admitting: Medical

## 2015-01-20 ENCOUNTER — Encounter (HOSPITAL_COMMUNITY): Payer: Self-pay

## 2015-01-20 ENCOUNTER — Encounter (HOSPITAL_COMMUNITY)
Admission: RE | Admit: 2015-01-20 | Discharge: 2015-01-20 | Disposition: A | Discharge status: Home / Self Care | Payer: 59 | Source: Ambulatory Visit | Attending: Obstetrics and Gynecology | Admitting: Obstetrics and Gynecology

## 2015-01-20 ENCOUNTER — Other Ambulatory Visit: Payer: Self-pay

## 2015-01-20 DIAGNOSIS — N8320 Unspecified ovarian cysts: Secondary | ICD-10-CM | POA: Diagnosis not present

## 2015-01-20 DIAGNOSIS — Z01818 Encounter for other preprocedural examination: Secondary | ICD-10-CM | POA: Diagnosis present

## 2015-01-20 DIAGNOSIS — D259 Leiomyoma of uterus, unspecified: Secondary | ICD-10-CM | POA: Insufficient documentation

## 2015-01-20 HISTORY — DX: Major depressive disorder, single episode, unspecified: F32.9

## 2015-01-20 HISTORY — DX: Anxiety disorder, unspecified: F41.9

## 2015-01-20 HISTORY — DX: Depression, unspecified: F32.A

## 2015-01-20 LAB — COMPREHENSIVE METABOLIC PANEL
ALT: 24 U/L (ref 14–54)
ANION GAP: 8 (ref 5–15)
AST: 26 U/L (ref 15–41)
Albumin: 4.2 g/dL (ref 3.5–5.0)
Alkaline Phosphatase: 54 U/L (ref 38–126)
BILIRUBIN TOTAL: 0.2 mg/dL — AB (ref 0.3–1.2)
BUN: 12 mg/dL (ref 6–20)
CO2: 22 mmol/L (ref 22–32)
Calcium: 9.4 mg/dL (ref 8.9–10.3)
Chloride: 108 mmol/L (ref 101–111)
Creatinine, Ser: 0.72 mg/dL (ref 0.44–1.00)
GFR calc Af Amer: >60 mL/min (ref >60)
GFR calc non Af Amer: >60 mL/min (ref >60)
GLUCOSE: 92 mg/dL (ref 65–99)
Potassium: 4 mmol/L (ref 3.5–5.1)
SODIUM: 138 mmol/L (ref 135–145)
TOTAL PROTEIN: 7.4 g/dL (ref 6.5–8.1)

## 2015-01-20 LAB — CBC
HCT: 40.2 % (ref 36.0–46.0)
HEMOGLOBIN: 14 g/dL (ref 12.0–15.0)
MCH: 32.1 pg (ref 26.0–34.0)
MCHC: 34.8 g/dL (ref 30.0–36.0)
MCV: 92.2 fL (ref 78.0–100.0)
PLATELETS: 240 10*3/uL (ref 150–400)
RBC: 4.36 MIL/uL (ref 3.87–5.11)
RDW: 15.6 % — ABNORMAL HIGH (ref 11.5–15.5)
WBC: 4.5 10*3/uL (ref 4.0–10.5)

## 2015-01-20 NOTE — Patient Instructions (Signed)
Your procedure is scheduled on:01/26/15  Enter through the Main Entrance at :6am Pick up desk phone and dial [phone] and inform us of your arrival.  Please call [phone] if you have any problems the morning of surgery.  Remember: Do not eat food or drink liquids, including water, after midnight:Sunday    You may brush your teeth the morning of surgery.  Take these meds the morning of surgery with a sip of water: Blood pressure pills, Wellbutrin  DO NOT wear jewelry, eye make-up, lipstick,body lotion, or dark fingernail polish.  (Polished toes are ok) You may wear deodorant.  If you are to be admitted after surgery, leave suitcase in car until your room has been assigned. Patients discharged on the day of surgery will not be allowed to drive home. Wear loose fitting, comfortable clothes for your ride home.

## 2015-01-25 ENCOUNTER — Encounter (HOSPITAL_COMMUNITY): Payer: Self-pay | Admitting: Anesthesiology

## 2015-01-25 NOTE — H&P (Signed)
Tara Briggs is an 44 y.o. female. 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  She has been followed by another physician, previously seen in our office.  She is now ready for definitive surgical therapy and prefers to have this done with [place]. She has previously been treated with Femara for heavy menses and anemia from her myomatous uterus and avoided surgery to maintain possibility of pregnancy.  She is now sure she does not want to pursue pregnancy.   Pertinent Gynecological History: Last pap: normal Date: 2015 OB History: G0  Menstrual History: No LMP recorded.    Past Medical History  Diagnosis Date  . Hypertension   . Polycystic ovary disease   . Infertility, female   . Seasonal allergies   . Anemia     blood loss due to heavy periods prior  . Blood transfusion without reported diagnosis 2013  . Brenner tumor of ovary   . Wears glasses   . Depression   . Anxiety   . GERD (gastroesophageal reflux disease)     Past Surgical History  Procedure Laterality Date  . Knee surgery      age 49yo, surgery to remove foreign body  . Cyst excision      left neck    Family History  Problem Relation Age of Onset  . Hypertension Mother   . Stroke Mother   . Heart disease Mother 57  . COPD Father   . Hypertension Father   . Kidney disease Father     tranplant, renal disease due to HTN  . Alcohol abuse Father   . Diabetes Father   . Cancer Paternal Grandmother     breast    Social History:  reports that she quit smoking about 5 weeks ago. She has never used smokeless tobacco. She reports that she does not drink alcohol or use illicit drugs.  Allergies: No Known Allergies  No prescriptions prior to admission    Review of Systems  Respiratory: Negative.   Cardiovascular: Negative.   Gastrointestinal: Negative.   Genitourinary: Negative.     There were no vitals taken for this visit. Physical Exam   Constitutional: She appears well-developed and well-nourished.  Neck: Neck supple. No thyromegaly present.  Cardiovascular: Normal rate, regular rhythm and normal heart sounds.   No murmur heard. Respiratory: Effort normal and breath sounds normal. No respiratory distress. She has no wheezes.  GI: Soft. She exhibits no distension and no mass. There is no tenderness.  Fundus is just palpable  Genitourinary: Vagina normal.  Uterus 10-12 weeks size, irregular Bilateral ovaries enlarged    No results found for this or any previous visit (from the past 24 hour(s)).  No results found.  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  She is ready to proceed with definitive surgical therapy.  Medical and surgical options have been discussed.  Surgical procedure and risks have been discussed.  Will proceed with TAH, probable BSO.  Rodrigo Mcgranahan D 01/25/2015, 8:20 PM

## 2015-01-25 NOTE — Anesthesia Preprocedure Evaluation (Addendum)
Anesthesia Evaluation  Patient identified by MRN, date of birth, ID band Patient awake    Reviewed: Allergy & Precautions, NPO status , Patient's Chart, lab work & pertinent test results, reviewed documented beta blocker date and time   Airway Mallampati: III  TM Distance: >3 FB Neck ROM: Full    Dental no notable dental hx. (+) Teeth Intact   Pulmonary neg pulmonary ROS, former smoker,  breath sounds clear to auscultation  Pulmonary exam normal       Cardiovascular hypertension, Pt. on medications Normal cardiovascular exam+ Valvular Problems/Murmurs Rhythm:Regular Rate:Normal     Neuro/Psych PSYCHIATRIC DISORDERS Anxiety Depression    GI/Hepatic Neg liver ROS,   Endo/Other  obesity  Renal/GU negative Renal ROS  negative genitourinary   Musculoskeletal negative musculoskeletal ROS (+)   Abdominal (+) + obese,   Peds  Hematology  (+) anemia ,   Anesthesia Other Findings   Reproductive/Obstetrics Symptomatic fibroids Bilateral ovarian masses                            Anesthesia Physical Anesthesia Plan  ASA: II  Anesthesia Plan: General   Post-op Pain Management:    Induction: Intravenous  Airway Management Planned: Oral ETT  Additional Equipment:   Intra-op Plan:   Post-operative Plan: Extubation in OR  Informed Consent: I have reviewed the patients History and Physical, chart, labs and discussed the procedure including the risks, benefits and alternatives for the proposed anesthesia with the patient or authorized representative who has indicated his/her understanding and acceptance.   Dental advisory given  Plan Discussed with: Anesthesiologist, CRNA and Surgeon  Anesthesia Plan Comments:        Anesthesia Quick Evaluation

## 2015-01-26 ENCOUNTER — Inpatient Hospital Stay (HOSPITAL_COMMUNITY): Payer: 59 | Admitting: Anesthesiology

## 2015-01-26 ENCOUNTER — Ambulatory Visit (HOSPITAL_COMMUNITY)
Admission: RE | Admit: 2015-01-26 | Discharge: 2015-01-27 | Disposition: A | Discharge status: Home / Self Care | Payer: 59 | Source: Ambulatory Visit | Attending: Obstetrics and Gynecology | Admitting: Obstetrics and Gynecology

## 2015-01-26 ENCOUNTER — Encounter (HOSPITAL_COMMUNITY)
Admission: RE | Disposition: A | Discharge status: Home / Self Care | Payer: Self-pay | Source: Ambulatory Visit | Attending: Obstetrics and Gynecology

## 2015-01-26 DIAGNOSIS — Z6830 Body mass index (BMI) 30.0-30.9, adult: Secondary | ICD-10-CM | POA: Insufficient documentation

## 2015-01-26 DIAGNOSIS — Z90710 Acquired absence of both cervix and uterus: Secondary | ICD-10-CM

## 2015-01-26 DIAGNOSIS — Z9079 Acquired absence of other genital organ(s): Secondary | ICD-10-CM

## 2015-01-26 DIAGNOSIS — F329 Major depressive disorder, single episode, unspecified: Secondary | ICD-10-CM | POA: Insufficient documentation

## 2015-01-26 DIAGNOSIS — E282 Polycystic ovarian syndrome: Secondary | ICD-10-CM | POA: Diagnosis not present

## 2015-01-26 DIAGNOSIS — Z87891 Personal history of nicotine dependence: Secondary | ICD-10-CM | POA: Diagnosis not present

## 2015-01-26 DIAGNOSIS — D3910 Neoplasm of uncertain behavior of unspecified ovary: Secondary | ICD-10-CM | POA: Insufficient documentation

## 2015-01-26 DIAGNOSIS — K219 Gastro-esophageal reflux disease without esophagitis: Secondary | ICD-10-CM | POA: Insufficient documentation

## 2015-01-26 DIAGNOSIS — I10 Essential (primary) hypertension: Secondary | ICD-10-CM | POA: Insufficient documentation

## 2015-01-26 DIAGNOSIS — N979 Female infertility, unspecified: Secondary | ICD-10-CM | POA: Diagnosis not present

## 2015-01-26 DIAGNOSIS — F419 Anxiety disorder, unspecified: Secondary | ICD-10-CM | POA: Diagnosis not present

## 2015-01-26 DIAGNOSIS — E669 Obesity, unspecified: Secondary | ICD-10-CM | POA: Diagnosis not present

## 2015-01-26 DIAGNOSIS — Z90722 Acquired absence of ovaries, bilateral: Secondary | ICD-10-CM

## 2015-01-26 DIAGNOSIS — D259 Leiomyoma of uterus, unspecified: Principal | ICD-10-CM | POA: Insufficient documentation

## 2015-01-26 DIAGNOSIS — D279 Benign neoplasm of unspecified ovary: Secondary | ICD-10-CM | POA: Insufficient documentation

## 2015-01-26 HISTORY — PX: ABDOMINAL HYSTERECTOMY: SHX81

## 2015-01-26 HISTORY — PX: SALPINGOOPHORECTOMY: SHX82

## 2015-01-26 LAB — PREGNANCY, URINE: Preg Test, Ur: NEGATIVE

## 2015-01-26 SURGERY — HYSTERECTOMY, ABDOMINAL
Anesthesia: General | Site: Abdomen

## 2015-01-26 MED ORDER — CEFAZOLIN SODIUM-DEXTROSE 2-3 GM-% IV SOLR
INTRAVENOUS | Status: AC
Start: 2015-01-26 — End: 2015-01-26
  Filled 2015-01-26: qty 50

## 2015-01-26 MED ORDER — HEPARIN SODIUM (PORCINE) 5000 UNIT/ML IJ SOLN
INTRAMUSCULAR | Status: AC
  Filled 2015-01-26: qty 1

## 2015-01-26 MED ORDER — HYDRALAZINE HCL 20 MG/ML IJ SOLN
5.0000 mg | Freq: Once | INTRAMUSCULAR | Status: AC
  Administered 2015-01-26: 5 mg via INTRAVENOUS

## 2015-01-26 MED ORDER — KETOROLAC TROMETHAMINE 30 MG/ML IJ SOLN
30.0000 mg | Freq: Four times a day (QID) | INTRAMUSCULAR | Status: DC
  Administered 2015-01-26 – 2015-01-27 (×3): 30 mg via INTRAVENOUS
  Filled 2015-01-26 (×3): qty 1

## 2015-01-26 MED ORDER — ONDANSETRON HCL 4 MG/2ML IJ SOLN
INTRAMUSCULAR | Status: DC | PRN
  Administered 2015-01-26: 4 mg via INTRAVENOUS

## 2015-01-26 MED ORDER — DEXTROSE-NACL 5-0.45 % IV SOLN
INTRAVENOUS | Status: DC
  Administered 2015-01-26 – 2015-01-27 (×3): via INTRAVENOUS

## 2015-01-26 MED ORDER — DEXAMETHASONE SODIUM PHOSPHATE 4 MG/ML IJ SOLN
INTRAMUSCULAR | Status: AC
Start: 2015-01-26 — End: 2015-01-26
  Filled 2015-01-26: qty 1

## 2015-01-26 MED ORDER — PHENYLEPHRINE 40 MCG/ML (10ML) SYRINGE FOR IV PUSH (FOR BLOOD PRESSURE SUPPORT)
PREFILLED_SYRINGE | INTRAVENOUS | Status: AC
  Filled 2015-01-26: qty 20

## 2015-01-26 MED ORDER — NEOSTIGMINE METHYLSULFATE 10 MG/10ML IV SOLN
INTRAVENOUS | Status: DC | PRN
  Administered 2015-01-26: 2 mg via INTRAVENOUS

## 2015-01-26 MED ORDER — HYDRALAZINE HCL 20 MG/ML IJ SOLN
5.0000 mg | INTRAMUSCULAR | Status: AC
  Administered 2015-01-26: 5 mg via INTRAVENOUS

## 2015-01-26 MED ORDER — MENTHOL 3 MG MT LOZG
1.0000 | LOZENGE | OROMUCOSAL | Status: DC | PRN
Start: 2015-01-26 — End: 2015-01-27

## 2015-01-26 MED ORDER — NEOSTIGMINE METHYLSULFATE 10 MG/10ML IV SOLN
INTRAVENOUS | Status: AC
  Filled 2015-01-26: qty 1

## 2015-01-26 MED ORDER — SIMETHICONE 80 MG PO CHEW: 80.0000 mg | CHEWABLE_TABLET | Freq: Four times a day (QID) | ORAL | Status: DC | PRN

## 2015-01-26 MED ORDER — KETOROLAC TROMETHAMINE 30 MG/ML IJ SOLN: 30.0000 mg | Freq: Four times a day (QID) | INTRAMUSCULAR | Status: DC

## 2015-01-26 MED ORDER — HYDROMORPHONE HCL 1 MG/ML IJ SOLN
0.2500 mg | INTRAMUSCULAR | Status: DC | PRN
  Administered 2015-01-26 (×4): 0.5 mg via INTRAVENOUS

## 2015-01-26 MED ORDER — KETOROLAC TROMETHAMINE 30 MG/ML IJ SOLN
INTRAMUSCULAR | Status: AC
  Filled 2015-01-26: qty 1

## 2015-01-26 MED ORDER — HYDROMORPHONE HCL 1 MG/ML IJ SOLN
INTRAMUSCULAR | Status: AC
  Administered 2015-01-26: 0.5 mg via INTRAVENOUS
  Filled 2015-01-26: qty 1

## 2015-01-26 MED ORDER — ONDANSETRON HCL 4 MG/2ML IJ SOLN: 4.0000 mg | Freq: Four times a day (QID) | INTRAMUSCULAR | Status: DC | PRN

## 2015-01-26 MED ORDER — KETOROLAC TROMETHAMINE 30 MG/ML IJ SOLN
INTRAMUSCULAR | Status: DC | PRN
  Administered 2015-01-26: 30 mg via INTRAVENOUS

## 2015-01-26 MED ORDER — DIPHENHYDRAMINE HCL 50 MG/ML IJ SOLN: 12.5000 mg | Freq: Four times a day (QID) | INTRAMUSCULAR | Status: DC | PRN

## 2015-01-26 MED ORDER — ROCURONIUM BROMIDE 100 MG/10ML IV SOLN
INTRAVENOUS | Status: DC | PRN
  Administered 2015-01-26: 5 mg via INTRAVENOUS
  Administered 2015-01-26: 10 mg via INTRAVENOUS
  Administered 2015-01-26: 35 mg via INTRAVENOUS
  Administered 2015-01-26: 5 mg via INTRAVENOUS

## 2015-01-26 MED ORDER — DIPHENHYDRAMINE HCL 12.5 MG/5ML PO ELIX: 12.5000 mg | ORAL_SOLUTION | Freq: Four times a day (QID) | ORAL | Status: DC | PRN

## 2015-01-26 MED ORDER — PROPOFOL 10 MG/ML IV BOLUS
INTRAVENOUS | Status: DC | PRN
  Administered 2015-01-26: 170 mg via INTRAVENOUS
  Administered 2015-01-26: 30 mg via INTRAVENOUS

## 2015-01-26 MED ORDER — IBUPROFEN 600 MG PO TABS
600.0000 mg | ORAL_TABLET | Freq: Four times a day (QID) | ORAL | Status: DC | PRN
  Administered 2015-01-27: 600 mg via ORAL
  Filled 2015-01-26: qty 1

## 2015-01-26 MED ORDER — SCOPOLAMINE 1 MG/3DAYS TD PT72
1.0000 | MEDICATED_PATCH | Freq: Once | TRANSDERMAL | Status: DC
  Administered 2015-01-26: 1.5 mg via TRANSDERMAL

## 2015-01-26 MED ORDER — FENTANYL CITRATE (PF) 250 MCG/5ML IJ SOLN
INTRAMUSCULAR | Status: AC
  Filled 2015-01-26: qty 5

## 2015-01-26 MED ORDER — FLUTICASONE PROPIONATE 50 MCG/ACT NA SUSP: 2.0000 | Freq: Every day | NASAL | Status: DC

## 2015-01-26 MED ORDER — LOSARTAN POTASSIUM 50 MG PO TABS
50.0000 mg | ORAL_TABLET | Freq: Every day | ORAL | Status: DC
  Administered 2015-01-27: 50 mg via ORAL
  Filled 2015-01-26 (×2): qty 1

## 2015-01-26 MED ORDER — METOCLOPRAMIDE HCL 5 MG/ML IJ SOLN: 10.0000 mg | Freq: Once | INTRAMUSCULAR | Status: DC | PRN

## 2015-01-26 MED ORDER — KETOROLAC TROMETHAMINE 30 MG/ML IJ SOLN: 30.0000 mg | Freq: Once | INTRAMUSCULAR | Status: DC

## 2015-01-26 MED ORDER — DEXAMETHASONE SODIUM PHOSPHATE 10 MG/ML IJ SOLN
INTRAMUSCULAR | Status: DC | PRN
  Administered 2015-01-26: 4 mg via INTRAVENOUS

## 2015-01-26 MED ORDER — MORPHINE SULFATE 4 MG/ML IJ SOLN
INTRAMUSCULAR | Status: AC
  Administered 2015-01-26: 4 mg via INTRAVENOUS
  Filled 2015-01-26: qty 1

## 2015-01-26 MED ORDER — OXYCODONE-ACETAMINOPHEN 5-325 MG PO TABS
1.0000 | ORAL_TABLET | ORAL | Status: DC | PRN
  Administered 2015-01-27: 1 via ORAL
  Filled 2015-01-26: qty 1

## 2015-01-26 MED ORDER — GLYCOPYRROLATE 0.2 MG/ML IJ SOLN
INTRAMUSCULAR | Status: AC
  Filled 2015-01-26: qty 2

## 2015-01-26 MED ORDER — BUPROPION HCL ER (XL) 150 MG PO TB24
150.0000 mg | ORAL_TABLET | Freq: Every day | ORAL | Status: DC
  Administered 2015-01-27: 150 mg via ORAL
  Filled 2015-01-26: qty 1

## 2015-01-26 MED ORDER — MORPHINE SULFATE (PF) 1 MG/ML IV SOLN
INTRAVENOUS | Status: DC
  Administered 2015-01-26: 6 mg via INTRAVENOUS
  Administered 2015-01-26: 7.5 mg via INTRAVENOUS
  Administered 2015-01-26: 3 mg via INTRAVENOUS

## 2015-01-26 MED ORDER — MORPHINE SULFATE (PF) 1 MG/ML IV SOLN
INTRAVENOUS | Status: DC
  Administered 2015-01-26: 12:00:00 via INTRAVENOUS
  Filled 2015-01-26: qty 25

## 2015-01-26 MED ORDER — NALOXONE HCL 0.4 MG/ML IJ SOLN: 0.4000 mg | INTRAMUSCULAR | Status: DC | PRN

## 2015-01-26 MED ORDER — MORPHINE SULFATE 4 MG/ML IJ SOLN
INTRAMUSCULAR | Status: AC
  Filled 2015-01-26: qty 1

## 2015-01-26 MED ORDER — PROPOFOL 10 MG/ML IV BOLUS
INTRAVENOUS | Status: AC
  Filled 2015-01-26: qty 20

## 2015-01-26 MED ORDER — HYDROMORPHONE HCL 1 MG/ML IJ SOLN
0.2500 mg | INTRAMUSCULAR | Status: DC | PRN
  Administered 2015-01-26 (×2): 0.5 mg via INTRAVENOUS

## 2015-01-26 MED ORDER — ONDANSETRON HCL 4 MG/2ML IJ SOLN
INTRAMUSCULAR | Status: AC
  Filled 2015-01-26: qty 2

## 2015-01-26 MED ORDER — SODIUM CHLORIDE 0.9 % IJ SOLN: 9.0000 mL | INTRAMUSCULAR | Status: DC | PRN

## 2015-01-26 MED ORDER — ONDANSETRON HCL 4 MG PO TABS: 4.0000 mg | ORAL_TABLET | Freq: Four times a day (QID) | ORAL | Status: DC | PRN

## 2015-01-26 MED ORDER — LIDOCAINE HCL (CARDIAC) 20 MG/ML IV SOLN
INTRAVENOUS | Status: DC | PRN
  Administered 2015-01-26: 80 mg via INTRAVENOUS

## 2015-01-26 MED ORDER — DIPHENHYDRAMINE HCL 12.5 MG/5ML PO ELIX
12.5000 mg | ORAL_SOLUTION | Freq: Four times a day (QID) | ORAL | Status: DC | PRN
  Administered 2015-01-26: 12.5 mg via ORAL
  Filled 2015-01-26: qty 5

## 2015-01-26 MED ORDER — MEPERIDINE HCL 25 MG/ML IJ SOLN: 6.2500 mg | INTRAMUSCULAR | Status: DC | PRN

## 2015-01-26 MED ORDER — MORPHINE SULFATE 4 MG/ML IJ SOLN
4.0000 mg | INTRAMUSCULAR | Status: AC | PRN
  Administered 2015-01-26 (×2): 4 mg via INTRAVENOUS

## 2015-01-26 MED ORDER — FENTANYL CITRATE (PF) 100 MCG/2ML IJ SOLN
INTRAMUSCULAR | Status: DC | PRN
  Administered 2015-01-26: 100 ug via INTRAVENOUS
  Administered 2015-01-26 (×3): 50 ug via INTRAVENOUS
  Administered 2015-01-26 (×2): 100 ug via INTRAVENOUS
  Administered 2015-01-26: 50 ug via INTRAVENOUS

## 2015-01-26 MED ORDER — HYDROMORPHONE 0.3 MG/ML IV SOLN
INTRAVENOUS | Status: DC
  Filled 2015-01-26: qty 25

## 2015-01-26 MED ORDER — SCOPOLAMINE 1 MG/3DAYS TD PT72
MEDICATED_PATCH | TRANSDERMAL | Status: AC
  Administered 2015-01-26: 1.5 mg via TRANSDERMAL
  Filled 2015-01-26: qty 1

## 2015-01-26 MED ORDER — GLYCOPYRROLATE 0.2 MG/ML IJ SOLN
INTRAMUSCULAR | Status: DC | PRN
  Administered 2015-01-26: 0.4 mg via INTRAVENOUS

## 2015-01-26 MED ORDER — SALINE SPRAY 0.65 % NA SOLN
1.0000 | NASAL | Status: DC | PRN
  Administered 2015-01-26: 1 via NASAL
  Filled 2015-01-26: qty 44

## 2015-01-26 MED ORDER — CEFAZOLIN SODIUM-DEXTROSE 2-3 GM-% IV SOLR
2.0000 g | INTRAVENOUS | Status: AC
  Administered 2015-01-26: 2 g via INTRAVENOUS

## 2015-01-26 MED ORDER — MIDAZOLAM HCL 2 MG/2ML IJ SOLN
INTRAMUSCULAR | Status: DC | PRN
  Administered 2015-01-26: 2 mg via INTRAVENOUS

## 2015-01-26 MED ORDER — DEXAMETHASONE SODIUM PHOSPHATE 10 MG/ML IJ SOLN: INTRAMUSCULAR | Status: DC | PRN

## 2015-01-26 MED ORDER — PHENYLEPHRINE HCL 10 MG/ML IJ SOLN
INTRAMUSCULAR | Status: DC | PRN
  Administered 2015-01-26: 120 ug via INTRAVENOUS
  Administered 2015-01-26 (×3): 80 ug via INTRAVENOUS

## 2015-01-26 MED ORDER — HYDRALAZINE HCL 20 MG/ML IJ SOLN
INTRAMUSCULAR | Status: AC
  Administered 2015-01-26: 5 mg via INTRAVENOUS
  Filled 2015-01-26: qty 1

## 2015-01-26 MED ORDER — AMLODIPINE BESYLATE 5 MG PO TABS
5.0000 mg | ORAL_TABLET | Freq: Every day | ORAL | Status: DC
  Administered 2015-01-27: 5 mg via ORAL
  Filled 2015-01-26 (×2): qty 1

## 2015-01-26 MED ORDER — ALUM & MAG HYDROXIDE-SIMETH 200-200-20 MG/5ML PO SUSP: 30.0000 mL | ORAL | Status: DC | PRN

## 2015-01-26 MED ORDER — LIDOCAINE HCL (CARDIAC) 20 MG/ML IV SOLN
INTRAVENOUS | Status: AC
  Filled 2015-01-26: qty 5

## 2015-01-26 MED ORDER — MIDAZOLAM HCL 2 MG/2ML IJ SOLN
INTRAMUSCULAR | Status: AC
  Filled 2015-01-26: qty 2

## 2015-01-26 MED ORDER — LACTATED RINGERS IV SOLN
INTRAVENOUS | Status: DC
  Administered 2015-01-26 (×3): via INTRAVENOUS

## 2015-01-26 SURGICAL SUPPLY — 41 items
APL SKNCLS STERI-STRIP NONHPOA (GAUZE/BANDAGES/DRESSINGS) ×2
BENZOIN TINCTURE PRP APPL 2/3 (GAUZE/BANDAGES/DRESSINGS) ×4 IMPLANT
CANISTER SUCT 3000ML (MISCELLANEOUS) ×4 IMPLANT
CLOSURE WOUND 1/2 X4 (GAUZE/BANDAGES/DRESSINGS) ×1
CLOTH BEACON ORANGE TIMEOUT ST (SAFETY) ×4 IMPLANT
CONT PATH 16OZ SNAP LID 3702 (MISCELLANEOUS) ×4 IMPLANT
DECANTER SPIKE VIAL GLASS SM (MISCELLANEOUS) IMPLANT
DRAPE WARM FLUID 44X44 (DRAPE) ×4 IMPLANT
DRSG OPSITE POSTOP 4X10 (GAUZE/BANDAGES/DRESSINGS) ×4 IMPLANT
DURAPREP 26ML APPLICATOR (WOUND CARE) ×4 IMPLANT
GLOVE BIO SURGEON STRL SZ8 (GLOVE) ×4 IMPLANT
GLOVE BIOGEL PI IND STRL 7.0 (GLOVE) ×4 IMPLANT
GLOVE BIOGEL PI INDICATOR 7.0 (GLOVE) ×4
GLOVE ORTHO TXT STRL SZ7.5 (GLOVE) ×4 IMPLANT
GOWN STRL REUS W/TWL LRG LVL3 (GOWN DISPOSABLE) ×8 IMPLANT
NEEDLE HYPO 22GX1.5 SAFETY (NEEDLE) IMPLANT
NS IRRIG 1000ML POUR BTL (IV SOLUTION) ×4 IMPLANT
PACK ABDOMINAL GYN (CUSTOM PROCEDURE TRAY) ×4 IMPLANT
PAD OB MATERNITY 4.3X12.25 (PERSONAL CARE ITEMS) ×1 IMPLANT
PROTECTOR NERVE ULNAR (MISCELLANEOUS) ×4 IMPLANT
RETRACTOR WND ALEXIS 25 LRG (MISCELLANEOUS) ×2 IMPLANT
RTRCTR WOUND ALEXIS 25CM LRG (MISCELLANEOUS) ×4
SPONGE LAP 18X18 X RAY DECT (DISPOSABLE) ×6 IMPLANT
STAPLER VISISTAT 35W (STAPLE) IMPLANT
STRIP CLOSURE SKIN 1/2X4 (GAUZE/BANDAGES/DRESSINGS) ×1 IMPLANT
SUT CHROMIC 1MO 4 18 CR8 (SUTURE) ×12 IMPLANT
SUT PDS AB 0 CTX 60 (SUTURE) ×6 IMPLANT
SUT PLAIN 2 0 (SUTURE) ×4
SUT PLAIN 2 0 XLH (SUTURE) IMPLANT
SUT PLAIN ABS 2-0 CT1 27XMFL (SUTURE) ×1 IMPLANT
SUT SILK 2 0 SH (SUTURE) ×4 IMPLANT
SUT VIC AB 0 CT1 27 (SUTURE)
SUT VIC AB 0 CT1 27XBRD ANBCTR (SUTURE) IMPLANT
SUT VIC AB 3-0 SH 27 (SUTURE) ×4
SUT VIC AB 3-0 SH 27X BRD (SUTURE) ×1 IMPLANT
SUT VIC AB 4-0 KS 27 (SUTURE) ×3 IMPLANT
SUT VICRYL 0 TIES 12 18 (SUTURE) ×4 IMPLANT
SYR CONTROL 10ML LL (SYRINGE) IMPLANT
TOWEL OR 17X24 6PK STRL BLUE (TOWEL DISPOSABLE) ×8 IMPLANT
TRAY FOLEY CATH SILVER 14FR (SET/KITS/TRAYS/PACK) ×4 IMPLANT
WATER STERILE IRR 1000ML POUR (IV SOLUTION) ×4 IMPLANT

## 2015-01-26 NOTE — Addendum Note (Signed)
Addendum  created 01/26/15 1731 by Elenore Paddy, CRNA   Modules edited: Notes Section   Notes Section:  File: [id number]

## 2015-01-26 NOTE — Progress Notes (Signed)
Dr Willis Modena notified of pain control issues with Dilaudid IV vs Morphine IV.  Order received to change PCA to Morphine full-dose.

## 2015-01-26 NOTE — Interval H&P Note (Signed)
History and Physical Interval Note:  01/26/2015 7:11 AM  Tara Briggs  has presented today for surgery, with the diagnosis of Symptomatic Myomas and Bilateral Ovarian Masses  The various methods of treatment have been discussed with the patient and family. After consideration of risks, benefits and other options for treatment, the patient has consented to  Procedure(s): HYSTERECTOMY ABDOMINAL (N/A) POSSIBLE BILATERAL SALPINGO OOPHORECTOMY (Bilateral) as a surgical intervention .  The patient's history has been reviewed, patient examined, no change in status, stable for surgery.  I have reviewed the patient's chart and labs.  Questions were answered to the patient's satisfaction.     Abelino Tippin D

## 2015-01-26 NOTE — Op Note (Signed)
Preoperative diagnosis: Symptomatic myomatous uterus, bilateral ovarian masses Postoperative diagnosis: Same Procedure: Total abdominal hysterectomy, bilateral salpingo-oophorectomy Surgeon: Cheri Fowler M.D. Assistant: Rica Mote M.D. Anesthesia: GETA Findings: She had a slightly enlarged uterus and both ovaries were significantly enlarged and irregular, mostly solid Estimated blood loss: 50 cc Specimens:  Pelvic washings, uterus, bilateral tubes and ovaries for routine pathology Complications: None  Procedure in detail:  The patient was taken to the operating room and placed in the dorsosupine position. General anesthesia was induced. Abdomen perineum and vagina were prepped and draped in the usual sterile fashion and a Foley catheter was placed. Her abdomen was then entered via a standard Pfannenstiel incision. An Alexis self-retaining retractor was placed and bowels were packed out of the pelvis. Pelvic washings were obtained. Uterine cornu were grasped with large Kelly clamps. The right ovary was significantly enlarged and was brought to the incision.  A Zeppelin clamp was placed across the infundibulopelvic ligament and the mesosalpinx and proximal tube and the ovary and tube were removed.  The pedicle was secured with #1 Chromic.  Round ligaments were taken down with electrocautery. On the left side a window was made in an avascular portion of the broad ligament, a Zeppelin clamp was placed across the uteroovarian ligament since the left ovary was difficult to mobilize, the pedicle was cut and secured with #1 Chromic.  Uterine arteries were skeletonized and the anterior peritoneum was incised and the bladder pushed inferiorly. The uterine artery, cardinal ligament, uterosacral ligament and vaginal angle pedicles were then taken down with Zeppelin clamps and all pedicles were sutured with #1 chromic and a figure-of-eight suture of #1 chromic was then used to close the rest of the vaginal  cuff. All pedicles were inspected and found to be hemostatic. The pelvis was irrigated and found to be hemostatic.  I was now able to elevate the left tube and ovary, place a Zeppelin clamp across the infundibulopelvic ligament, cut the pedicle and secure it with #1 pedicle. The uterosacral ligaments were plicated in the midline with 2-0 silk, and the previously placed uterosacral pedicles were also tied together.  All pedicles were inspected and found to be hemostatic, the suture lines were superior to the ureters.  The lap sponges were removed from the abdomen and the Alexis retractor was removed. Peritoneum was identified and closed with running 2-0 Vicryl. Fascia was closed in a running fashion starting at both ends and meeting in the middle with 0 Vicryl. Subcutaneous tissue was irrigated and made hemostatic with Bovie. Subcutaneous tissue was then closed with running 2-0 plain gut suture. Skins incision was then closed with running 4-0 Vicryl subcuticular suture followed by steri-strips. Patient tolerated procedure well and was taken to the PACU in stable condition. Counts were correct, she had PAS hose on throughout the procedure, she received appropriate antibiotic prophylaxis.

## 2015-01-26 NOTE — Progress Notes (Signed)
Day of Surgery Procedure(s) (LRB): HYSTERECTOMY ABDOMINAL (N/A)  BILATERAL SALPINGO OOPHORECTOMY (Bilateral)  Subjective: Patient reports tolerating PO.  No n/v.  Pain controlled overall. Awake and alert  Objective: I have reviewed patient's vital signs and intake and output.  Good UOP Some initial hypertension post-op but improved now  General: alert and cooperative GI: soft NT  Assessment: s/p Procedure(s): HYSTERECTOMY ABDOMINAL (N/A)  BILATERAL SALPINGO OOPHORECTOMY (Bilateral): stable  Plan: Continue care  LOS: 0 days    Tara Briggs W 01/26/2015, 6:07 PM

## 2015-01-26 NOTE — Anesthesia Postprocedure Evaluation (Signed)
  Anesthesia Post-op Note  Patient: Tara Briggs  Procedure(s) Performed: Procedure(s): HYSTERECTOMY ABDOMINAL (N/A)  BILATERAL SALPINGO OOPHORECTOMY (Bilateral)  Patient Location: PACU and Women's Unit  Anesthesia Type:General  Level of Consciousness: awake, alert  and oriented  Airway and Oxygen Therapy: Patient Spontanous Breathing  Post-op Pain: mild  Post-op Assessment: Patient's Cardiovascular Status Stable, Respiratory Function Stable, Patent Airway, No signs of Nausea or vomiting, Adequate PO intake and Pain level controlled              Post-op Vital Signs: Reviewed and stable  Last Vitals:  Filed Vitals:   01/26/15 1548  BP:   Pulse:   Temp:   Resp: 14    Complications: No apparent anesthesia complications

## 2015-01-26 NOTE — Anesthesia Postprocedure Evaluation (Signed)
  Anesthesia Post-op Note  Patient: Tara Briggs  Procedure(s) Performed: Procedure(s): HYSTERECTOMY ABDOMINAL (N/A)  BILATERAL SALPINGO OOPHORECTOMY (Bilateral)  Patient Location: PACU  Anesthesia Type:General  Level of Consciousness: awake, alert  and oriented  Airway and Oxygen Therapy: Patient Spontanous Breathing  Post-op Pain: mild  Post-op Assessment: Post-op Vital signs reviewed, Patient's Cardiovascular Status Stable, Respiratory Function Stable, Patent Airway, No signs of Nausea or vomiting and Pain level controlled. Somewhat difficult intubation. Glidescope used after 2 attempts at direct laryngoscopy with #2 miller and #3 MAC.              Post-op Vital Signs: Reviewed and stable  Last Vitals:  Filed Vitals:   01/26/15 0935  BP:   Pulse: 88  Temp:   Resp: 22    Complications: No apparent anesthesia complications

## 2015-01-26 NOTE — Anesthesia Procedure Notes (Signed)
Procedure Name: Intubation Date/Time: 01/26/2015 7:29 AM Performed by: Casimer Lanius A Pre-anesthesia Checklist: Patient identified, Emergency Drugs available, Suction available and Patient being monitored Patient Re-evaluated:Patient Re-evaluated prior to inductionOxygen Delivery Method: Circle system utilized and Simple face mask Preoxygenation: Pre-oxygenation with 100% oxygen Intubation Type: IV induction and Inhalational induction Ventilation: Mask ventilation without difficulty, Mask ventilation with difficulty and Oral airway inserted - appropriate to patient size Laryngoscope Size: Mac, 3, Miller, 2 and Glidescope Grade View: Grade III Tube type: Oral Tube size: 7.0 mm Number of attempts: 3 (Foster Miller 2, MF Mac 3, MF Glide Lo Pro 3 Grade3 with glide) Airway Equipment and Method: Stylet,  Video-laryngoscopy and Oral airway Placement Confirmation: ETT inserted through vocal cords under direct vision,  positive ETCO2 and breath sounds checked- equal and bilateral Secured at: 20 (right lip) cm Tube secured with: Tape Dental Injury: Teeth and Oropharynx as per pre-operative assessment and Bloody posterior oropharynx  Difficulty Due To: Difficulty was unanticipated and Difficult Airway- due to anterior larynx Future Recommendations: Recommend- induction with short-acting agent, and alternative techniques readily available

## 2015-01-26 NOTE — Progress Notes (Addendum)
Pt having increasing BP since admission to PACU.  Last BP 175/105, HR 75.  Patient seems very anxious and concerned re: amt of pain she is experiencing.  Asked pt if she takes anything for anxiety and she states that she sometimes takes her mother's xanax.  C/o pressure in lower abdomen that is 7/10 on pain scale.  Dilaudid prn will ease pain shortly after dose, but pain returns very quickly.  Dr. Royce Macadamia notified of pain and BP issues.  Orders received to give additional Dilaudid and hydralazine IV.  Will continue to reassess.

## 2015-01-26 NOTE — Transfer of Care (Signed)
Immediate Anesthesia Transfer of Care Note  Patient: Tara Briggs  Procedure(s) Performed: Procedure(s): HYSTERECTOMY ABDOMINAL (N/A)  BILATERAL SALPINGO OOPHORECTOMY (Bilateral)  Patient Location: PACU  Anesthesia Type:General  Level of Consciousness: awake  Airway & Oxygen Therapy: Patient Spontanous Breathing  Post-op Assessment: Report given to PACU RN  Post vital signs: stable  Filed Vitals:   01/26/15 0611  BP: 140/87  Pulse: 69  Temp: 37 C  Resp: 20    Complications: No apparent anesthesia complications

## 2015-01-27 ENCOUNTER — Encounter (HOSPITAL_COMMUNITY): Payer: Self-pay | Admitting: Obstetrics and Gynecology

## 2015-01-27 DIAGNOSIS — D259 Leiomyoma of uterus, unspecified: Secondary | ICD-10-CM | POA: Diagnosis not present

## 2015-01-27 LAB — CBC
HEMATOCRIT: 36 % (ref 36.0–46.0)
Hemoglobin: 12.4 g/dL (ref 12.0–15.0)
MCH: 32.3 pg (ref 26.0–34.0)
MCHC: 34.4 g/dL (ref 30.0–36.0)
MCV: 93.8 fL (ref 78.0–100.0)
Platelets: 240 10*3/uL (ref 150–400)
RBC: 3.84 MIL/uL — AB (ref 3.87–5.11)
RDW: 15.8 % — ABNORMAL HIGH (ref 11.5–15.5)
WBC: 7.4 10*3/uL (ref 4.0–10.5)

## 2015-01-27 MED ORDER — IBUPROFEN 600 MG PO TABS: 600.0000 mg | ORAL_TABLET | Freq: Four times a day (QID) | ORAL | Status: AC | PRN

## 2015-01-27 MED ORDER — OXYCODONE-ACETAMINOPHEN 5-325 MG PO TABS: 1.0000 | ORAL_TABLET | ORAL | Status: DC | PRN

## 2015-01-27 MED ORDER — MORPHINE SULFATE 1 MG/ML IV SOLN
INTRAVENOUS | Status: DC
  Administered 2015-01-27: 6 mg via INTRAVENOUS
  Administered 2015-01-27: 3 mg via INTRAVENOUS
  Filled 2015-01-27: qty 25

## 2015-01-27 NOTE — Discharge Summary (Signed)
Physician Discharge Summary  Patient ID: Tara Briggs MRN: [id number] DOB/AGE: 09-28-1970 44 y.o.  Admit date: 01/26/2015 Discharge date: 01/27/2015  Admission Diagnoses:  Bilateral ovarian masses, myomatous uterus  Discharge Diagnoses: Same Active Problems:   S/P total hysterectomy and bilateral salpingo-oophorectomy   Discharged Condition: good  Hospital Course: Underwent TAH/BSO without difficulty, initially had difficulty with pain control but this rapidly improved and she requested discharge on POD #1.  Discharge Exam: Blood pressure 156/94, pulse 76, temperature 99.1 F (37.3 C), temperature source Oral, resp. rate 18, height 5\' 4"  (1.626 m), weight 81.194 kg (179 lb), SpO2 97 %. General appearance: alert  Disposition: 01-Home or Self Care  Discharge Instructions    Diet - low sodium heart healthy    Complete by:  As directed      Increase activity slowly    Complete by:  As directed      Lifting restrictions    Complete by:  As directed   10 lbs     Sexual Activity Restrictions    Complete by:  As directed   Pelvic rest            Medication List    STOP taking these medications        amLODipine 5 MG tablet  Commonly known as:  NORVASC     ferrous sulfate 325 (65 FE) MG tablet     letrozole 2.5 MG tablet  Commonly known as:  FEMARA     metFORMIN 1000 MG tablet  Commonly known as:  GLUCOPHAGE     pantoprazole 40 MG tablet  Commonly known as:  PROTONIX      TAKE these medications        buPROPion 150 MG 24 hr tablet  Commonly known as:  WELLBUTRIN XL  Take 1 tablet (150 mg total) by mouth daily.     CALCIUM 600 PO  Take 1 tablet by mouth daily.     cholecalciferol 400 UNITS Tabs tablet  Commonly known as:  VITAMIN D  Take 400 Units by mouth daily.     ibuprofen 600 MG tablet  Commonly known as:  ADVIL,MOTRIN  Take 1 tablet (600 mg total) by mouth every 6 (six) hours as needed (mild pain).     loratadine 10 MG tablet  Commonly known  as:  CLARITIN  Take 10 mg by mouth daily as needed for allergies.     losartan 50 MG tablet  Commonly known as:  COZAAR  Take 50 mg by mouth daily.     methyldopa 500 MG tablet  Commonly known as:  ALDOMET  TAKE 1 TABLET BY MOUTH THREE TIMES DAILY     multivitamin with minerals Tabs tablet  Take 1 tablet by mouth daily.     oxyCODONE-acetaminophen 5-325 MG per tablet  Commonly known as:  PERCOCET/ROXICET  Take 1-2 tablets by mouth every 4 (four) hours as needed for severe pain (moderate to severe pain (when tolerating fluids)).     vitamin C 500 MG tablet  Commonly known as:  ASCORBIC ACID  Take 500 mg by mouth daily.           Follow-up Information    Follow up with Elesa Massed., NP. Schedule an appointment as soon as possible for a visit in 2 weeks.   Specialty:  Obstetrics and Gynecology   Why:  For wound re-check   Contact information:   [address] [place] [address] [phone]  Follow up with Nelta Caudill D, MD. Schedule an appointment as soon as possible for a visit in 6 weeks.   Specialty:  Obstetrics and Gynecology   Why:  post-op   Contact information:   [address] [place] [address] [phone]       Signed: Clarene Duke 01/27/2015, 8:41 AM

## 2015-01-27 NOTE — Discharge Instructions (Signed)
Routine instructions for hysterectomy °

## 2015-01-27 NOTE — Progress Notes (Signed)
Pt verbalized understanding of d/c instructions, medications, follow up appts, when to seek medical attention and belongings policy. Pt sates that she knocked her IV and it "fell out." NT entered room and pt was holding gauze to the site. Pt has no questions at time of d/c. Pt called to desk to notify staff she was ready to leave. Pt was escorted to the main entrance by Audrie Lia, NT. Pt wouldn't wait for her SO to drive up to main entrance, but rather chose to walk herself to the car. Pt had copy of d/c instructions, prescription and work note at time of d/c. Marry Guan

## 2015-01-27 NOTE — Progress Notes (Signed)
1 Day Post-Op Procedure(s) (LRB): HYSTERECTOMY ABDOMINAL (N/A)  BILATERAL SALPINGO OOPHORECTOMY (Bilateral)  Subjective: Patient reports incisional pain and tolerating PO.    Objective: I have reviewed patient's vital signs, intake and output and labs.  General: alert GI: soft, dressing C/Briggs/I  Assessment: s/p Procedure(s): HYSTERECTOMY ABDOMINAL (N/A)  BILATERAL SALPINGO OOPHORECTOMY (Bilateral): stable and progressing well  Plan: Advance diet Encourage ambulation Advance to PO medication Discontinue IV fluids Discharge home  LOS: 1 day    Tara Briggs 01/27/2015, 8:37 AM

## 2015-01-28 MED FILL — Heparin Sodium (Porcine) Inj 5000 Unit/ML: INTRAMUSCULAR | Qty: 1 | Status: AC

## 2015-09-27 ENCOUNTER — Other Ambulatory Visit: Payer: Self-pay | Admitting: Medical

## 2015-10-22 ENCOUNTER — Other Ambulatory Visit: Payer: Self-pay

## 2015-10-22 DIAGNOSIS — Z1231 Encounter for screening mammogram for malignant neoplasm of breast: Secondary | ICD-10-CM

## 2015-11-05 ENCOUNTER — Ambulatory Visit: Payer: No Typology Code available for payment source

## 2015-12-24 ENCOUNTER — Ambulatory Visit
Admission: RE | Admit: 2015-12-24 | Discharge: 2015-12-24 | Disposition: A | Discharge status: Home / Self Care | Payer: BLUE CROSS/BLUE SHIELD | Source: Ambulatory Visit

## 2015-12-24 DIAGNOSIS — Z1231 Encounter for screening mammogram for malignant neoplasm of breast: Secondary | ICD-10-CM

## 2016-12-29 ENCOUNTER — Other Ambulatory Visit: Payer: Self-pay | Admitting: Nurse Practitioner

## 2016-12-29 DIAGNOSIS — Z1231 Encounter for screening mammogram for malignant neoplasm of breast: Secondary | ICD-10-CM

## 2017-01-18 ENCOUNTER — Ambulatory Visit
Admission: RE | Admit: 2017-01-18 | Discharge: 2017-01-18 | Disposition: A | Discharge status: Home / Self Care | Payer: BLUE CROSS/BLUE SHIELD | Source: Ambulatory Visit | Attending: Nurse Practitioner | Admitting: Nurse Practitioner

## 2017-01-18 DIAGNOSIS — Z1231 Encounter for screening mammogram for malignant neoplasm of breast: Secondary | ICD-10-CM

## 2017-01-19 ENCOUNTER — Other Ambulatory Visit: Payer: Self-pay | Admitting: Nurse Practitioner

## 2017-01-19 DIAGNOSIS — R928 Other abnormal and inconclusive findings on diagnostic imaging of breast: Secondary | ICD-10-CM

## 2017-01-27 ENCOUNTER — Ambulatory Visit
Admission: RE | Admit: 2017-01-27 | Discharge: 2017-01-27 | Disposition: A | Discharge status: Home / Self Care | Payer: BLUE CROSS/BLUE SHIELD | Source: Ambulatory Visit | Attending: Nurse Practitioner | Admitting: Nurse Practitioner

## 2017-01-27 ENCOUNTER — Other Ambulatory Visit: Payer: Self-pay | Admitting: Nurse Practitioner

## 2017-01-27 DIAGNOSIS — N63 Unspecified lump in unspecified breast: Secondary | ICD-10-CM

## 2017-01-27 DIAGNOSIS — R928 Other abnormal and inconclusive findings on diagnostic imaging of breast: Secondary | ICD-10-CM

## 2017-08-03 ENCOUNTER — Ambulatory Visit
Admission: RE | Admit: 2017-08-03 | Discharge: 2017-08-03 | Disposition: A | Discharge status: Home / Self Care | Payer: BLUE CROSS/BLUE SHIELD | Source: Ambulatory Visit | Attending: Nurse Practitioner | Admitting: Nurse Practitioner

## 2017-08-03 DIAGNOSIS — N63 Unspecified lump in unspecified breast: Secondary | ICD-10-CM

## 2019-01-31 IMAGING — US ULTRASOUND LEFT BREAST LIMITED
1 series · 6 of 6 positions shown · non-contrast
Comparison: Mammography 01/18/2017, 12/23/2016.

CLINICAL DATA: Recall from 2D screening mammography, possible mass
or focal asymmetry in outer left breast at anterior depth.

EXAM:
2D DIGITAL DIAGNOSTIC LEFT MAMMOGRAM WITH CAD AND ADJUNCT TOMO
ULTRASOUND LEFT BREAST

[Series 1: ultrasound left breast limited · 0.07mm/px · 6 of 6 slices shown]
[im 1/6]
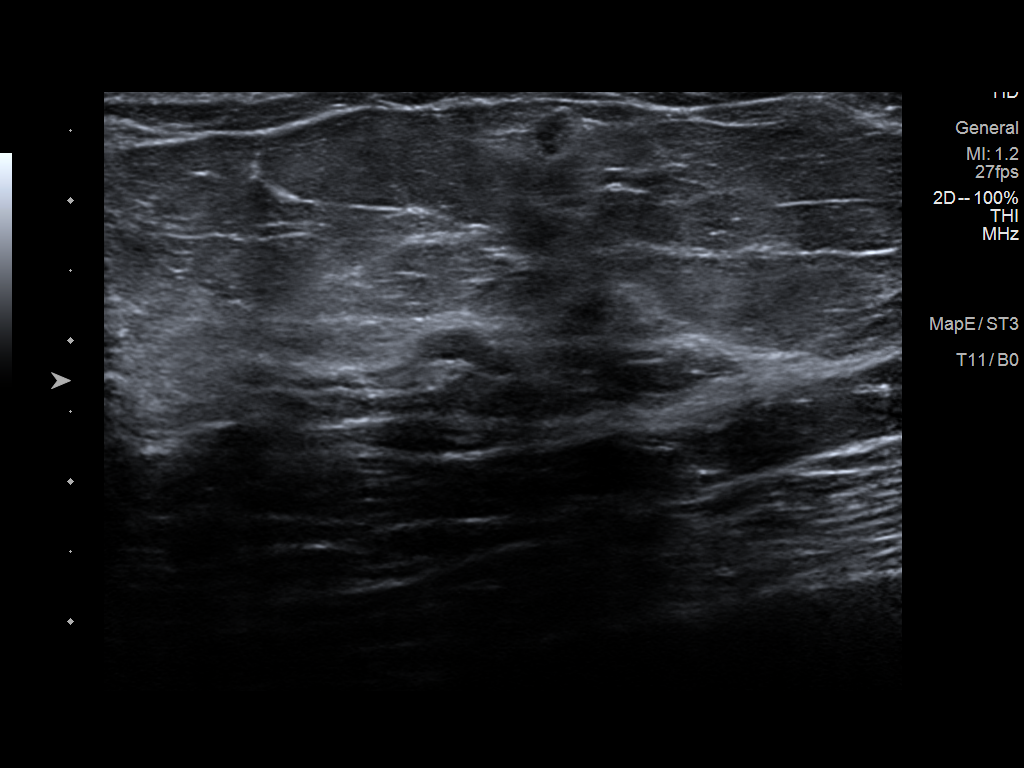
[im 2/6]
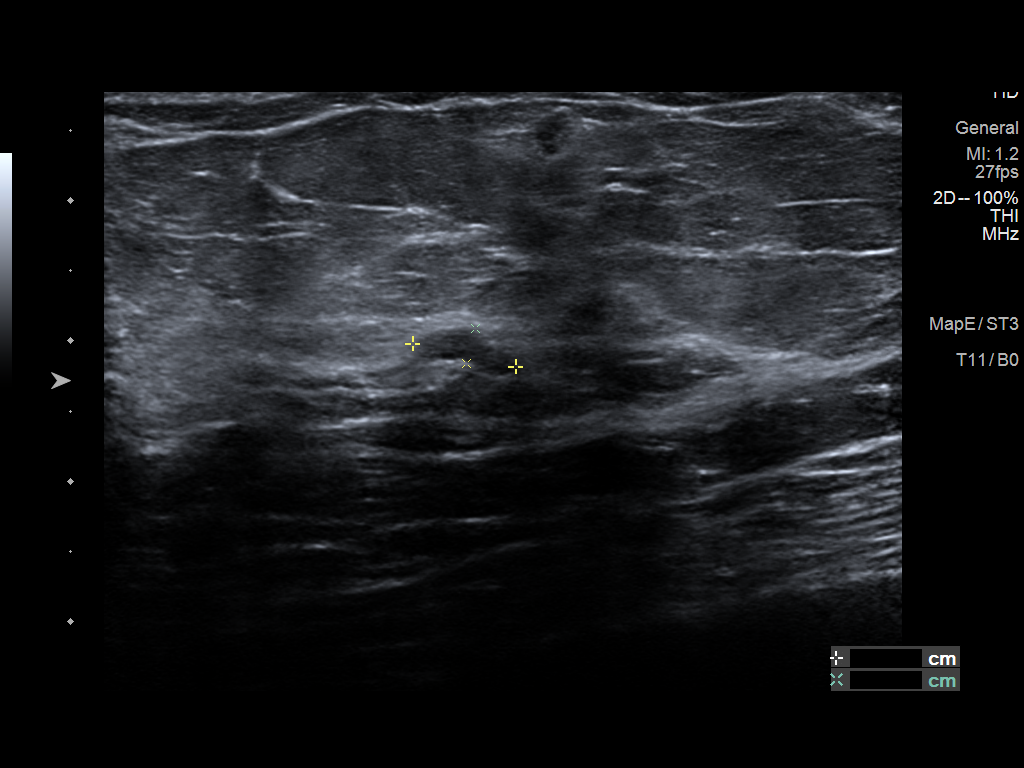
[im 3/6]
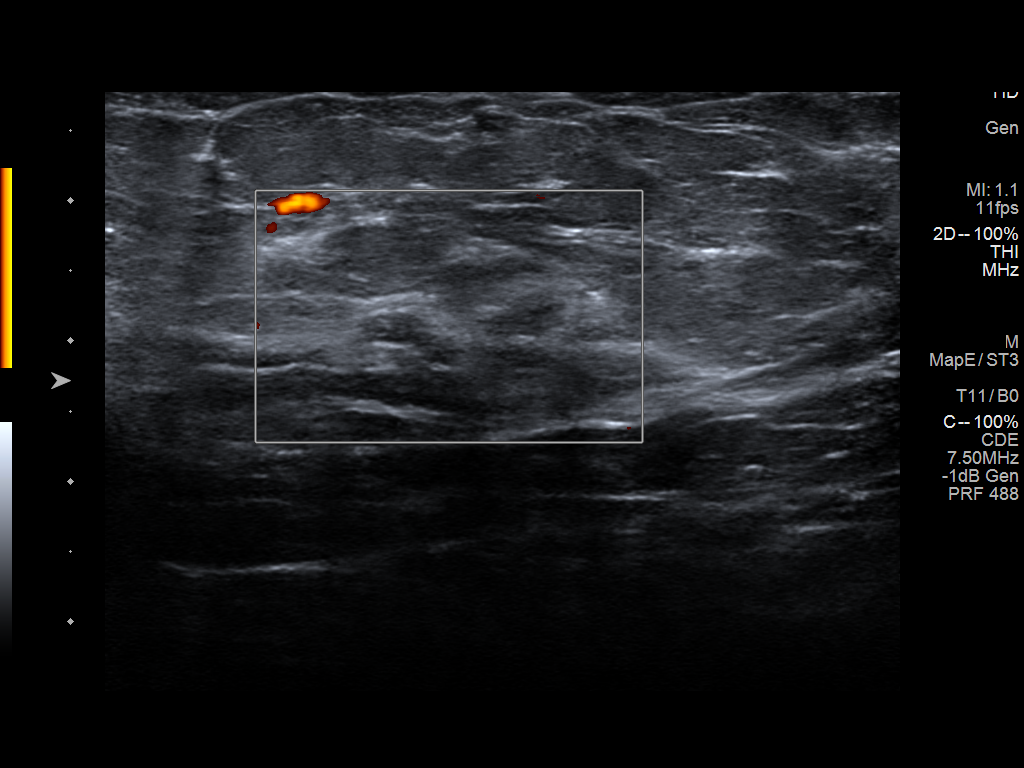
[im 4/6]
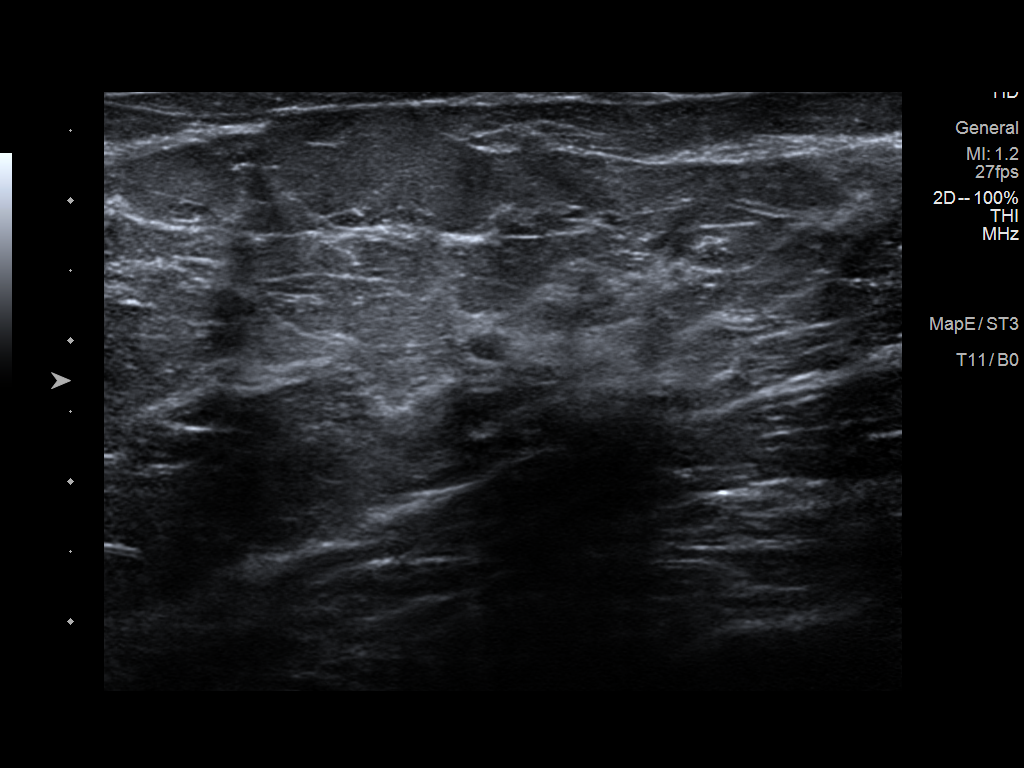
[im 5/6]
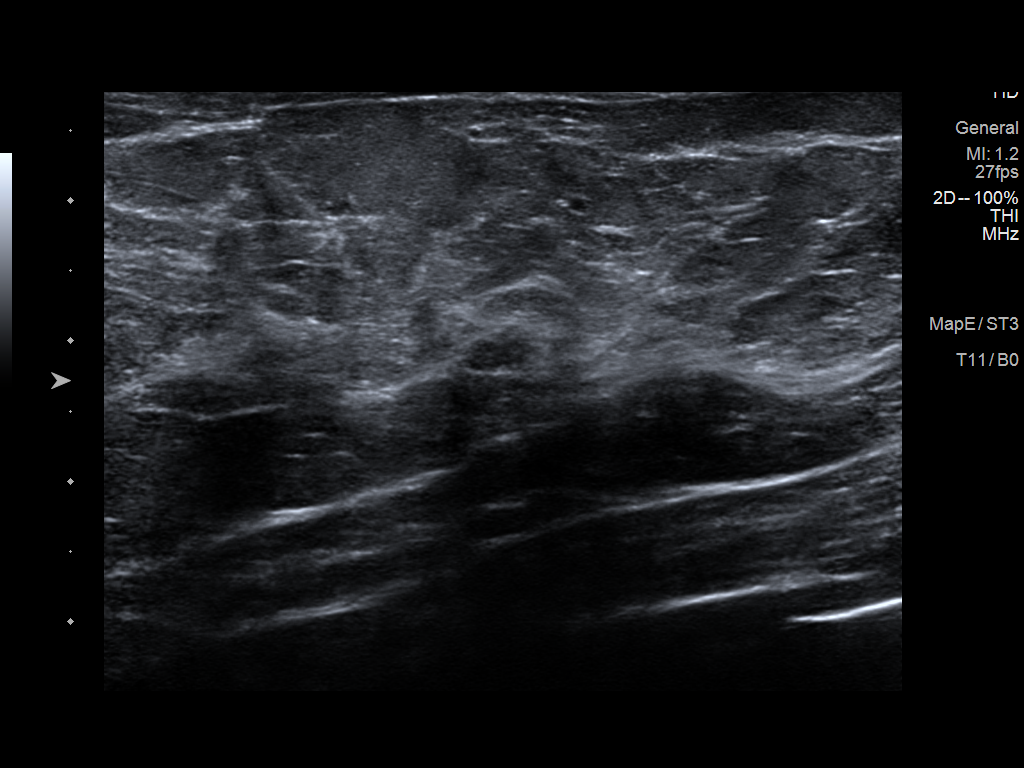
[im 6/6]
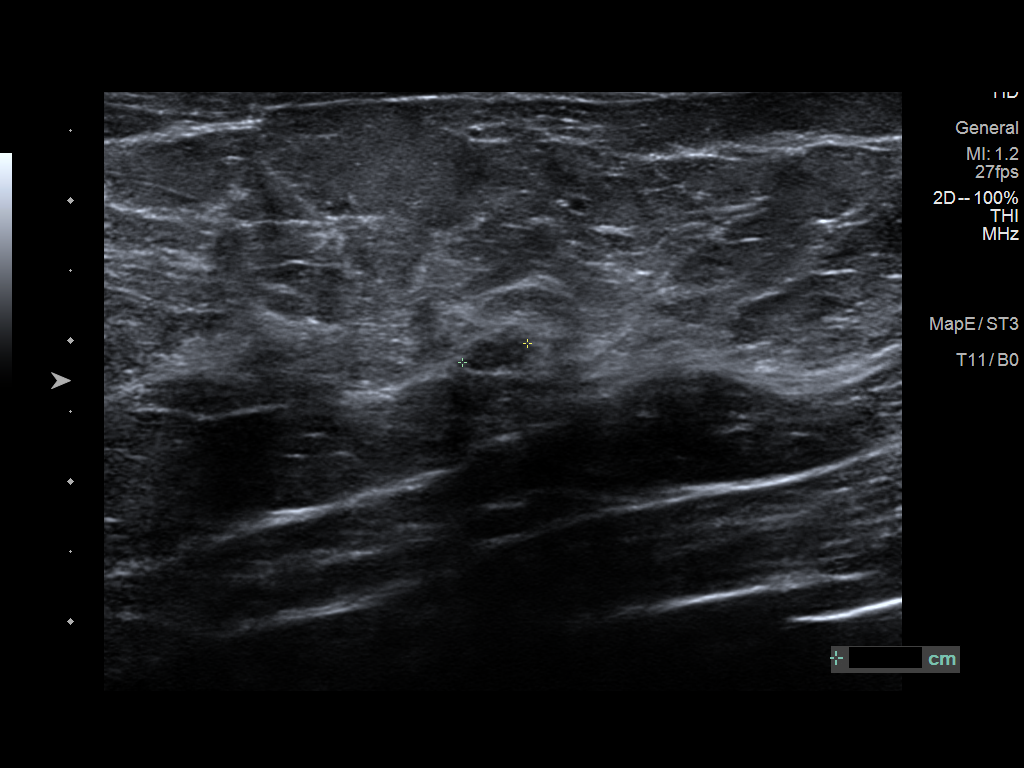

[6 of 6 positions shown; findings below may reference images not displayed]

No prior
ultrasound.

ACR Breast Density Category b: There are scattered areas of
fibroglandular density.
FINDINGS: Standard 2D and tomosynthesis full field CC and MLO views of the
left breast were obtained. Standard and tomosynthesis
spot-compression CC and MLO views of the area of concern in the left
breast were also obtained.

The spot compression tomosynthesis images confirm a low-density mass
with interspersed fat in the outer left breast, measuring
approximately 7-8 mm. There is no associated architectural
distortion or suspicious calcification. The mass is just medial and
deep to a normal intramammary lymph node. A normal intramammary
lymph node is also present in the lower outer right breast at
posterior depth, best seen on the full field tomosynthesis images.
No suspicious findings elsewhere in the left breast.

Mammographic images were processed with CAD.

On physical exam, there is no palpable abnormality in the outer left
breast.

Targeted left breast ultrasound is performed, showing an oval
circumscribed parallel hypoechoic mass at the 3 o'clock position
approximately 3 cm from the nipple measuring approximate 3 x 8 x 5
mm, deep to the superficial intramammary lymph node as noted on
mammography, demonstrating posterior acoustic enhancement and no
internal power Doppler flow, corresponding to the area of concern.
No suspicious solid mass or abnormal acoustic shadowing is
identified.
IMPRESSION: Likely benign 8 mm focus of clustered cysts/apocrine metaplasia or
complex cyst in the outer left breast which accounts for the area of
concern on screening mammography.

RECOMMENDATION:
Diagnostic left mammogram and left breast ultrasound in 6 months.

I have discussed the findings and recommendations with the patient.
Results were also provided in writing at the conclusion of the
visit. If applicable, a reminder letter will be sent to the patient
regarding the next appointment.

BI-RADS CATEGORY  3: Probably benign.

## 2020-06-19 ENCOUNTER — Encounter: Payer: Self-pay | Admitting: Podiatry

## 2020-06-19 ENCOUNTER — Other Ambulatory Visit: Payer: Self-pay

## 2020-06-19 ENCOUNTER — Ambulatory Visit: Payer: 59 | Admitting: Podiatry

## 2020-06-19 DIAGNOSIS — M79674 Pain in right toe(s): Secondary | ICD-10-CM | POA: Diagnosis not present

## 2020-06-19 DIAGNOSIS — M2041 Other hammer toe(s) (acquired), right foot: Secondary | ICD-10-CM

## 2020-06-19 DIAGNOSIS — Z720 Tobacco use: Secondary | ICD-10-CM | POA: Diagnosis not present

## 2020-06-19 DIAGNOSIS — L84 Corns and callosities: Secondary | ICD-10-CM | POA: Diagnosis not present

## 2020-06-19 NOTE — Progress Notes (Signed)
  Subjective:  Patient ID: Tara Briggs, female    DOB: Dec 28, 1970,  MRN: [id number]  Chief Complaint  Patient presents with  . Callouses    Right foot 5th toe corn. PT stated that she has tried using corn pads and soaking her toes but nothing has helped     49 y.o. female presents with the above complaint. History confirmed with patient.   Objective:  Physical Exam: warm, good capillary refill, no trophic changes or ulcerative lesions, normal DP and PT pulses and normal sensory exam.  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 Hammertoe of right foot   2. Callus of foot   3. Pain in toe of right foot   4. Current tobacco use      Plan:  Patient was evaluated and treated and all questions answered.  All symptomatic hyperkeratoses were safely debrided with a sterile #15 blade to patient's level of comfort without incident. We discussed preventative and palliative care of these lesions including supportive and accommodative shoegear, padding, prefabricated and custom molded accommodative orthoses, use of a pumice stone and lotions/creams daily.  Silicone pads recommended and these were dispensed today as well as more info more to get these.  Urea cream is also recommended   Long-term for the corn I think she would benefit from correction of the fifth toe deformity and arthroplasty with a derotational skin plasty to correct the deformity and remove the corn.  She is interested in this, is currently closing on a house and would like to plan for this in January.  I discussed with her that as an active smoker (she smokes 1.5 packs/day currently) she is at high risk for complications following surgery including infection and/or wound healing complications or painful scar.  She says she will be able to quit smoking for the preoperative and postoperative.  And will work on this in the interim before I see  her again.  We will take x-rays at her next visit to evaluate for surgery..  Return in about 2 months (around 08/19/2020) for X-rays at next visit.

## 2020-06-19 NOTE — Patient Instructions (Addendum)
Look for urea 40% cream or ointment and apply to the thickened dry skin / calluses. This can be bought over the counter, at a pharmacy or online such as [employer].   More silicone pads can be purchased from:  [email or web address]       Hammer Toe  Hammer toe is a change in the shape (a deformity) of your toe. The deformity causes the middle joint of your toe to stay bent. This causes pain, especially when you are wearing shoes. Hammer toe starts gradually. At first, the toe can be straightened. Gradually over time, the deformity becomes stiff and permanent. Early treatments to keep the toe straight may relieve pain. As the deformity becomes stiff and permanent, surgery may be needed to straighten the toe. What are the causes? Hammer toe is caused by abnormal bending of the toe joint that is closest to your foot. It happens gradually over time. This pulls on the muscles and connections (tendons) of the toe joint, making them weak and stiff. It is often related to wearing shoes that are too short or narrow and do not let your toes straighten. What increases the risk? You may be at greater risk for hammer toe if you:  Are female.  Are older.  Wear shoes that are too small.  Wear high-heeled shoes that pinch your toes.  Are a Engineer, mining.  Have a second toe that is longer than your big toe (first toe).  Injure your foot or toe.  Have arthritis.  Have a family history of hammer toe.  Have a nerve or muscle disorder. What are the signs or symptoms? The main symptoms of this condition are pain and deformity of the toe. The pain is worse when wearing shoes, walking, or running. Other symptoms may include:  Corns or calluses over the bent part of the toe or between the toes.  Redness and a burning feeling on the toe.  An open sore that forms on the top of the toe.  Not being able to straighten the toe. How is this diagnosed? This condition is diagnosed based on  your symptoms and a physical exam. During the exam, your health care provider will try to straighten your toe to see how stiff the deformity is. You may also have tests, such as:  A blood test to check for rheumatoid arthritis.  An X-ray to show how severe the deformity is. How is this treated? Treatment for this condition will depend on how stiff the deformity is. Surgery is often needed. However, sometimes a hammer toe can be straightened without surgery. Treatments that do not involve surgery include:  Taping the toe into a straightened position.  Using pads and cushions to protect the toe (orthotics).  Wearing shoes that provide enough room for the toes.  Doing toe-stretching exercises at home.  Taking an NSAID to reduce pain and swelling. If these treatments do not help or the toe cannot be straightened, surgery is the next option. The most common surgeries used to straighten a hammer toe include:  Arthroplasty. In this procedure, part of the joint is removed, and that allows the toe to straighten.  Fusion. In this procedure, cartilage between the two bones of the joint is taken out and the bones are fused together into one longer bone.  Implantation. In this procedure, part of the bone is removed and replaced with an implant to let the toe move again.  Flexor tendon transfer. In this procedure, the tendons that curl the toes down (  flexor tendons) are repositioned. Follow these instructions at home:  Take over-the-counter and prescription medicines only as told by your health care provider.  Do toe straightening and stretching exercises as told by your health care provider.  Keep all follow-up visits as told by your health care provider. This is important. How is this prevented?  Wear shoes that give your toes enough room and do not cause pain.  Do not wear high-heeled shoes. Contact a health care provider if:  Your pain gets worse.  Your toe becomes red or  swollen.  You develop an open sore on your toe. This information is not intended to replace advice given to you by your health care provider. Make sure you discuss any questions you have with your health care provider. Document Revised: 07/14/2017 Document Reviewed: 11/25/2015 Elsevier Patient Education  2020 [employer].

## 2020-08-21 ENCOUNTER — Ambulatory Visit: Payer: 59 | Admitting: Podiatry

## 2022-06-07 DIAGNOSIS — N951 Menopausal and female climacteric states: Secondary | ICD-10-CM | POA: Diagnosis not present

## 2022-06-07 DIAGNOSIS — Z1231 Encounter for screening mammogram for malignant neoplasm of breast: Secondary | ICD-10-CM | POA: Diagnosis not present

## 2022-06-07 DIAGNOSIS — Z6835 Body mass index (BMI) 35.0-35.9, adult: Secondary | ICD-10-CM | POA: Diagnosis not present

## 2022-06-07 DIAGNOSIS — Z130 Encounter for screening for diseases of the blood and blood-forming organs and certain disorders involving the immune mechanism: Secondary | ICD-10-CM | POA: Diagnosis not present

## 2022-06-07 DIAGNOSIS — Z1389 Encounter for screening for other disorder: Secondary | ICD-10-CM | POA: Diagnosis not present

## 2022-06-07 DIAGNOSIS — Z01419 Encounter for gynecological examination (general) (routine) without abnormal findings: Secondary | ICD-10-CM | POA: Diagnosis not present

## 2022-06-23 DIAGNOSIS — D124 Benign neoplasm of descending colon: Secondary | ICD-10-CM | POA: Diagnosis not present

## 2022-06-23 DIAGNOSIS — Z1211 Encounter for screening for malignant neoplasm of colon: Secondary | ICD-10-CM | POA: Diagnosis not present

## 2022-06-23 DIAGNOSIS — K648 Other hemorrhoids: Secondary | ICD-10-CM | POA: Diagnosis not present

## 2022-06-23 DIAGNOSIS — K635 Polyp of colon: Secondary | ICD-10-CM | POA: Diagnosis not present

## 2022-07-27 DIAGNOSIS — H6121 Impacted cerumen, right ear: Secondary | ICD-10-CM | POA: Diagnosis not present

## 2022-07-27 DIAGNOSIS — R69 Illness, unspecified: Secondary | ICD-10-CM | POA: Diagnosis not present

## 2022-07-27 DIAGNOSIS — I10 Essential (primary) hypertension: Secondary | ICD-10-CM | POA: Diagnosis not present

## 2022-07-27 DIAGNOSIS — F17200 Nicotine dependence, unspecified, uncomplicated: Secondary | ICD-10-CM | POA: Diagnosis not present

## 2022-07-27 DIAGNOSIS — E669 Obesity, unspecified: Secondary | ICD-10-CM | POA: Diagnosis not present

## 2022-07-27 DIAGNOSIS — Z0000 Encounter for general adult medical examination without abnormal findings: Secondary | ICD-10-CM | POA: Diagnosis not present

## 2022-07-28 DIAGNOSIS — R399 Unspecified symptoms and signs involving the genitourinary system: Secondary | ICD-10-CM | POA: Diagnosis not present

## 2022-10-20 ENCOUNTER — Encounter (HOSPITAL_COMMUNITY): Payer: Self-pay

## 2022-10-20 ENCOUNTER — Ambulatory Visit (HOSPITAL_COMMUNITY)
Admission: EM | Admit: 2022-10-20 | Discharge: 2022-10-20 | Disposition: A | Discharge status: Home / Self Care | Payer: 59 | Attending: Internal Medicine | Admitting: Internal Medicine

## 2022-10-20 DIAGNOSIS — N3001 Acute cystitis with hematuria: Secondary | ICD-10-CM | POA: Insufficient documentation

## 2022-10-20 LAB — POCT URINALYSIS DIPSTICK, ED / UC
Bilirubin Urine: NEGATIVE
Glucose, UA: NEGATIVE mg/dL
Ketones, ur: 40 mg/dL — AB
Nitrite: POSITIVE — AB
Protein, ur: 30 mg/dL — AB
Specific Gravity, Urine: 1.025 (ref 1.005–1.030)
Urobilinogen, UA: 1 mg/dL (ref 0.0–1.0)
pH: 5.5 (ref 5.0–8.0)

## 2022-10-20 MED ORDER — CEPHALEXIN 500 MG PO CAPS: 500.0000 mg | ORAL_CAPSULE | Freq: Two times a day (BID) | ORAL | 0 refills | Status: AC

## 2022-10-20 NOTE — ED Triage Notes (Signed)
Pt c/o burning on urination with urgency/frequency since last night. States had blood in urine this am. Denies taking any meds.

## 2022-10-20 NOTE — ED Provider Notes (Signed)
[facility]    CSN: [id number] Arrival date & time: 10/20/22  1620      History   Chief Complaint Chief Complaint  Patient presents with   Urinary Tract Infection    HPI Tara Briggs is a 52 y.o. female comes to the urgent care with 1 day history of burning with urination, bloody urine, urgency and frequency.  Patient's symptoms started last night and has been persistent.  She denies any fever or chills..  No generalized body aches or flank pain.  No vaginal discharge or itching.  HPI  Past Medical History:  Diagnosis Date   Anemia    blood loss due to heavy periods prior   Anxiety    Blood transfusion without reported diagnosis 2013   Brenner tumor of ovary    Depression    GERD (gastroesophageal reflux disease)    Hypertension    Infertility, female    Polycystic ovary disease    Seasonal allergies    Wears glasses     Patient Active Problem List   Diagnosis Date Noted   S/P total hysterectomy and bilateral salpingo-oophorectomy 01/26/2015   Abdominal pain, other specified site 03/03/2012   Anemia due to chronic illness 03/03/2012   Rectal bleeding 03/03/2012   Fibroid 03/03/2012    Past Surgical History:  Procedure Laterality Date   ABDOMINAL HYSTERECTOMY N/A 01/26/2015   Procedure: HYSTERECTOMY ABDOMINAL;  Surgeon: Cheri Fowler, MD;  Location: [facility];  Service: Gynecology;  Laterality: N/A;   CYST EXCISION     left neck   KNEE SURGERY     age 4yo, surgery to remove foreign body   SALPINGOOPHORECTOMY Bilateral 01/26/2015   Procedure:  BILATERAL SALPINGO OOPHORECTOMY;  Surgeon: Cheri Fowler, MD;  Location: [facility];  Service: Gynecology;  Laterality: Bilateral;    OB History   No obstetric history on file.      Home Medications    Prior to Admission medications   Medication Sig Start Date End Date Taking? Authorizing Provider  cephALEXin (KEFLEX) 500 MG capsule Take 1 capsule (500 mg total) by mouth 2 (two) times daily for 5 days.  10/20/22 10/25/22 Yes Jacquelynne Guedes, Myrene Galas, MD  amLODipine (NORVASC) 5 MG tablet Take 5 mg by mouth daily. 06/16/20   [provider]  buPROPion (WELLBUTRIN XL) 150 MG 24 hr tablet Take 1 tablet (150 mg total) by mouth daily. Patient not taking: Reported on 10/20/2022 05/08/13   Tysinger, Camelia Eng, PA-C  Calcium Carbonate (CALCIUM 600 PO) Take 1 tablet by mouth daily.    [provider]  Calcium Carbonate-Vitamin D 600-400 MG-UNIT tablet Take by mouth.    [provider]  cetirizine (ZYRTEC) 10 MG tablet Take by mouth.    [provider]  cholecalciferol (VITAMIN D) 400 UNITS TABS tablet Take 400 Units by mouth daily.    [provider]  Docusate Sodium (DSS) 100 MG CAPS Take by mouth.    [provider]  estradiol (VIVELLE-DOT) 0.075 MG/24HR 1 patch 2 (two) times a week. 06/16/20   [provider]  fluticasone (FLONASE) 50 MCG/ACT nasal spray USE 2 SPRAYS IN EACH NOSTRIL TWICE DAILY AS NEEDED FOR RHINITIS 02/02/12   [provider]  ibuprofen (ADVIL,MOTRIN) 600 MG tablet Take 1 tablet (600 mg total) by mouth every 6 (six) hours as needed (mild pain). 01/27/15   Meisinger, Sherren Mocha, MD  loratadine (CLARITIN) 10 MG tablet Take 10 mg by mouth daily as needed for allergies.    [provider]  losartan (COZAAR) 50 MG tablet Take 50 mg by mouth daily.    [provider]  methyldopa (ALDOMET) 500 MG tablet TAKE 1 TABLET BY MOUTH THREE TIMES DAILY 06/01/13   Bailey Mech E, PA-C  Multiple Vitamin (MULTIVITAMIN WITH MINERALS) TABS Take 1 tablet by mouth daily.    [provider]  Omega-3 1000 MG CAPS Take by mouth.    [provider]  PARoxetine (PAXIL) 10 MG tablet Take 10 mg by mouth daily. 03/19/20   [provider]  varenicline (CHANTIX STARTING MONTH PAK) 0.5 MG X 11 & 1 MG X 42 tablet See admin instructions. 03/20/17   [provider]  vitamin C (ASCORBIC ACID) 500 MG tablet Take 500 mg by  mouth daily.    [provider]  vitamin E 45 MG (100 UNITS) capsule Take by mouth.    [provider]    Family History Family History  Problem Relation Age of Onset   Hypertension Mother    Stroke Mother    Heart disease Mother 49   COPD Father    Hypertension Father    Kidney disease Father        tranplant, renal disease due to HTN   Alcohol abuse Father    Diabetes Father    Cancer Paternal Grandmother        breast    Social History Social History   Tobacco Use   Smoking status: Former    Packs/day: 0.25    Types: Cigarettes    Quit date: 12/20/2014    Years since quitting: 7.8   Smokeless tobacco: Never  Substance Use Topics   Alcohol use: No   Drug use: No     Allergies   Patient has no known allergies.   Review of Systems Review of Systems As per HPI  Physical Exam Triage Vital Signs ED Triage Vitals  Enc Vitals Group     BP 10/20/22 1709 (!) 160/96     Pulse Rate 10/20/22 1709 80     Resp 10/20/22 1709 18     Temp 10/20/22 1709 98.2 F (36.8 C)     Temp Source 10/20/22 1709 Oral     SpO2 10/20/22 1709 96 %     Weight --      Height --      Head Circumference --      Peak Flow --      Pain Score 10/20/22 1711 0     Pain Loc --      Pain Edu? --      Excl. in [place]? --    No data found.  Updated Vital Signs BP (!) 160/96 (BP Location: Left Arm)   Pulse 80   Temp 98.2 F (36.8 C) (Oral)   Resp 18   LMP 01/18/2015 (Approximate)   SpO2 96%   Visual Acuity Right Eye Distance:   Left Eye Distance:   Bilateral Distance:    Right Eye Near:   Left Eye Near:    Bilateral Near:     Physical Exam Vitals and nursing note reviewed.  Constitutional:      General: She is not in acute distress.    Appearance: She is not ill-appearing.  Cardiovascular:     Rate and Rhythm: Normal rate and regular rhythm.     Pulses: Normal pulses.     Heart sounds: Normal heart sounds.  Pulmonary:     Effort: Pulmonary effort is normal.  Breath sounds: Normal breath sounds.  Abdominal:     General: Bowel sounds are normal.     Palpations: Abdomen is soft.  Neurological:     Mental Status: She is alert.      UC Treatments / Results  Labs (all labs ordered are listed, but only abnormal results are displayed) Labs Reviewed  POCT URINALYSIS DIPSTICK, ED / UC - Abnormal; Notable for the following components:      Result Value   Ketones, ur 40 (*)    Hgb urine dipstick LARGE (*)    Protein, ur 30 (*)    Nitrite POSITIVE (*)    Leukocytes,Ua MODERATE (*)    All other components within normal limits  URINE CULTURE    EKG   Radiology No results found.  Procedures Procedures (including critical care time)  Medications Ordered in UC Medications - No data to display  Initial Impression / Assessment and Plan / UC Course  I have reviewed the triage vital signs and the nursing notes.  Pertinent labs & imaging results that were available during my care of the patient were reviewed by me and considered in my medical decision making (see chart for details).     1.  Acute cystitis with hematuria: Point-of-care urinalysis is positive for leukocyte Estrace, blood and nitrites Urine cultures have been sent Keflex 500 mg twice daily for 5 days Patient is advised to increase oral fluid intake Will call patient with recommendations if labs are abnormal Return precautions given. Final Clinical Impressions(s) / UC Diagnoses   Final diagnoses:  Acute cystitis with hematuria     Discharge Instructions      Please take antibiotics as prescribed Increase oral fluid intake We will call you with recommendations if urine culture is abnormal Return to urgent care if you have worsening symptoms.   ED Prescriptions     Medication Sig Dispense Auth. Provider   cephALEXin (KEFLEX) 500 MG capsule Take 1 capsule (500 mg total) by mouth 2 (two) times daily for 5 days. 10 capsule Lorrin Nawrot, Myrene Galas, MD      PDMP  not reviewed this encounter.   Chase Picket, MD 10/20/22 [phone]

## 2022-10-20 NOTE — Discharge Instructions (Addendum)
Please take antibiotics as prescribed Increase oral fluid intake We will call you with recommendations if urine culture is abnormal Return to urgent care if you have worsening symptoms.

## 2022-10-23 LAB — URINE CULTURE: Culture: >=100000 — AB

## 2023-02-02 DIAGNOSIS — I10 Essential (primary) hypertension: Secondary | ICD-10-CM | POA: Diagnosis not present

## 2023-02-02 DIAGNOSIS — Z1211 Encounter for screening for malignant neoplasm of colon: Secondary | ICD-10-CM | POA: Diagnosis not present

## 2023-02-02 DIAGNOSIS — F17210 Nicotine dependence, cigarettes, uncomplicated: Secondary | ICD-10-CM | POA: Diagnosis not present

## 2023-02-02 DIAGNOSIS — R7309 Other abnormal glucose: Secondary | ICD-10-CM | POA: Diagnosis not present

## 2023-06-27 DIAGNOSIS — N951 Menopausal and female climacteric states: Secondary | ICD-10-CM | POA: Diagnosis not present

## 2023-06-27 DIAGNOSIS — Z01419 Encounter for gynecological examination (general) (routine) without abnormal findings: Secondary | ICD-10-CM | POA: Diagnosis not present

## 2023-06-27 DIAGNOSIS — Z1231 Encounter for screening mammogram for malignant neoplasm of breast: Secondary | ICD-10-CM | POA: Diagnosis not present

## 2023-06-27 DIAGNOSIS — Z6834 Body mass index (BMI) 34.0-34.9, adult: Secondary | ICD-10-CM | POA: Diagnosis not present

## 2023-06-27 DIAGNOSIS — Z1389 Encounter for screening for other disorder: Secondary | ICD-10-CM | POA: Diagnosis not present

## 2023-07-24 DIAGNOSIS — Z111 Encounter for screening for respiratory tuberculosis: Secondary | ICD-10-CM | POA: Diagnosis not present

## 2023-07-31 DIAGNOSIS — Z111 Encounter for screening for respiratory tuberculosis: Secondary | ICD-10-CM | POA: Diagnosis not present

## 2023-07-31 DIAGNOSIS — E669 Obesity, unspecified: Secondary | ICD-10-CM | POA: Diagnosis not present

## 2023-07-31 DIAGNOSIS — Z0000 Encounter for general adult medical examination without abnormal findings: Secondary | ICD-10-CM | POA: Diagnosis not present

## 2023-07-31 DIAGNOSIS — I10 Essential (primary) hypertension: Secondary | ICD-10-CM | POA: Diagnosis not present

## 2023-07-31 DIAGNOSIS — Z23 Encounter for immunization: Secondary | ICD-10-CM | POA: Diagnosis not present

## 2023-12-13 DIAGNOSIS — D122 Benign neoplasm of ascending colon: Secondary | ICD-10-CM | POA: Diagnosis not present

## 2023-12-13 DIAGNOSIS — Z1211 Encounter for screening for malignant neoplasm of colon: Secondary | ICD-10-CM | POA: Diagnosis not present

## 2023-12-13 DIAGNOSIS — K648 Other hemorrhoids: Secondary | ICD-10-CM | POA: Diagnosis not present

## 2023-12-13 DIAGNOSIS — K635 Polyp of colon: Secondary | ICD-10-CM | POA: Diagnosis not present

## 2024-03-21 DIAGNOSIS — E669 Obesity, unspecified: Secondary | ICD-10-CM | POA: Diagnosis not present

## 2024-03-21 DIAGNOSIS — I10 Essential (primary) hypertension: Secondary | ICD-10-CM | POA: Diagnosis not present

## 2024-03-21 DIAGNOSIS — F3289 Other specified depressive episodes: Secondary | ICD-10-CM | POA: Diagnosis not present

## 2024-03-21 DIAGNOSIS — R7309 Other abnormal glucose: Secondary | ICD-10-CM | POA: Diagnosis not present
